# Patient Record
Sex: Female | Born: 1985 | Hispanic: No | Smoking: Never smoker
Health system: Southern US, Community
[De-identification: ages and names within clinical notes are randomized; demographics above are authoritative.]

## PROBLEM LIST (undated history)

## (undated) DIAGNOSIS — Z789 Other specified health status: Secondary | ICD-10-CM

## (undated) DIAGNOSIS — O24419 Gestational diabetes mellitus in pregnancy, unspecified control: Secondary | ICD-10-CM

---

## 2016-07-10 LAB — OB RESULTS CONSOLE ABO/RH: RH Type: POSITIVE

## 2016-07-10 LAB — OB RESULTS CONSOLE RPR: RPR: NONREACTIVE

## 2016-07-10 LAB — OB RESULTS CONSOLE HEPATITIS B SURFACE ANTIGEN: Hepatitis B Surface Ag: NEGATIVE

## 2016-07-10 LAB — OB RESULTS CONSOLE ANTIBODY SCREEN: Antibody Screen: NEGATIVE

## 2016-07-10 LAB — OB RESULTS CONSOLE GC/CHLAMYDIA
CHLAMYDIA, DNA PROBE: NEGATIVE
GC PROBE AMP, GENITAL: NEGATIVE

## 2016-07-10 LAB — OB RESULTS CONSOLE RUBELLA ANTIBODY, IGM: Rubella: IMMUNE

## 2016-07-10 LAB — OB RESULTS CONSOLE HIV ANTIBODY (ROUTINE TESTING): HIV: NONREACTIVE

## 2016-09-27 LAB — OB RESULTS CONSOLE GBS: STREP GROUP B AG: POSITIVE

## 2016-10-09 ENCOUNTER — Other Ambulatory Visit: Payer: Self-pay | Admitting: Obstetrics and Gynecology

## 2016-10-09 ENCOUNTER — Encounter (HOSPITAL_COMMUNITY): Payer: Self-pay

## 2016-10-09 ENCOUNTER — Inpatient Hospital Stay (HOSPITAL_COMMUNITY)
Admission: AD | Admit: 2016-10-09 | Discharge: 2016-10-09 | Disposition: A | Discharge status: Home / Self Care | Payer: Medicaid Other | Source: Ambulatory Visit | Attending: Obstetrics and Gynecology | Admitting: Obstetrics and Gynecology

## 2016-10-09 DIAGNOSIS — R1011 Right upper quadrant pain: Secondary | ICD-10-CM

## 2016-10-09 DIAGNOSIS — O26893 Other specified pregnancy related conditions, third trimester: Secondary | ICD-10-CM | POA: Diagnosis not present

## 2016-10-09 DIAGNOSIS — S39011A Strain of muscle, fascia and tendon of abdomen, initial encounter: Secondary | ICD-10-CM | POA: Diagnosis not present

## 2016-10-09 DIAGNOSIS — Z3A37 37 weeks gestation of pregnancy: Secondary | ICD-10-CM | POA: Insufficient documentation

## 2016-10-09 HISTORY — DX: Other specified health status: Z78.9

## 2016-10-09 MED ORDER — CYCLOBENZAPRINE HCL 10 MG PO TABS
10.0000 mg | ORAL_TABLET | Freq: Once | ORAL | Status: AC
  Administered 2016-10-09: 10 mg via ORAL
  Filled 2016-10-09: qty 1

## 2016-10-09 MED ORDER — CYCLOBENZAPRINE HCL 10 MG PO TABS: 10.0000 mg | ORAL_TABLET | Freq: Two times a day (BID) | ORAL | 0 refills | Status: DC | PRN

## 2016-10-09 NOTE — MAU Note (Signed)
Pt presents complaining of pain on her right back that is constant. Denies leaking or bleeding. Reports good fetal movement. Repeat c/s planned.

## 2016-10-09 NOTE — Discharge Instructions (Signed)
Round Ligament Pain Introduction The round ligament is a cord of muscle and tissue that helps to support the uterus. It can become a source of pain during pregnancy if it becomes stretched or twisted as the baby grows. The pain usually begins in the second trimester of pregnancy, and it can come and go until the baby is delivered. It is not a serious problem, and it does not cause harm to the baby. Round ligament pain is usually a short, sharp, and pinching pain, but it can also be a dull, lingering, and aching pain. The pain is felt in the lower side of the abdomen or in the groin. It usually starts deep in the groin and moves up to the outside of the hip area. Pain can occur with:  A sudden change in position.  Rolling over in bed.  Coughing or sneezing.  Physical activity. Follow these instructions at home: Watch your condition for any changes. Take these steps to help with your pain:  When the pain starts, relax. Then try:  Sitting down.  Flexing your knees up to your abdomen.  Lying on your side with one pillow under your abdomen and another pillow between your legs.  Sitting in a warm bath for 15-20 minutes or until the pain goes away.  Take over-the-counter and prescription medicines only as told by your health care provider.  Move slowly when you sit and stand.  Avoid long walks if they cause pain.  Stop or lessen your physical activities if they cause pain. Contact a health care provider if:  Your pain does not go away with treatment.  You feel pain in your back that you did not have before.  Your medicine is not helping. Get help right away if:  You develop a fever or chills.  You develop uterine contractions.  You develop vaginal bleeding.  You develop nausea or vomiting.  You develop diarrhea.  You have pain when you urinate. This information is not intended to replace advice given to you by your health care provider. Make sure you discuss any questions  you have with your health care provider. Document Released: 06/19/2008 Document Revised: 02/16/2016 Document Reviewed: 11/17/2014  2017 Elsevier  

## 2016-10-09 NOTE — MAU Provider Note (Signed)
  History     CSN: [id number]  Arrival date and time: 10/09/16 2005   First Provider Initiated Contact with Patient 10/09/16 2044      Chief Complaint  Patient presents with  . Back Pain   HPI Ms. Lindsey Diaz is a 31 y.o. G2P1001 at 9244w5d who presents to MAU today with complaint of right sided pain. The patient states pain has been worse since 1800 today. She has not taken anything for pain. She rates her pain at 8/10 now. She states pain is worse with ambulation and she is able to recreate the pain with certain movements. She denies UTI symptoms, hematuria, vaginal bleeding, LOF, contractions or complications with the pregnancy. She is scheduled for repeat C/S.   OB History    Gravida Para Term Preterm AB Living   2 1 1     1    SAB TAB Ectopic Multiple Live Births           1      Past Medical History:  Diagnosis Date  . Medical history non-contributory     Past Surgical History:  Procedure Laterality Date  . CESAREAN SECTION      History reviewed. No pertinent family history.  Social History  Substance Use Topics  . Smoking status: Never Smoker  . Smokeless tobacco: Never Used  . Alcohol use No    Allergies: No Known Allergies  Prescriptions Prior to Admission  Medication Sig Dispense Refill Last Dose  . guaiFENesin (MUCINEX) 600 MG 12 hr tablet Take 600 mg by mouth 2 (two) times daily as needed for cough or to loosen phlegm.   10/08/2016 at Unknown time  . Prenatal Vit-Fe Fumarate-FA (PRENATAL MULTIVITAMIN) TABS tablet Take 1 tablet by mouth daily at 12 noon.   10/09/2016 at Unknown time    Review of Systems  Constitutional: Negative for fever.  Gastrointestinal: Positive for abdominal pain. Negative for constipation, diarrhea, nausea and vomiting.  Genitourinary: Negative for dysuria, flank pain, frequency, hematuria and urgency.   Physical Exam   Blood pressure 132/78, pulse 90, temperature 98.3 F (36.8 C), temperature source Oral, resp. rate  18.  Physical Exam  Nursing note and vitals reviewed. Constitutional: She is oriented to person, place, and time. She appears well-developed and well-nourished. No distress.  HENT:  Head: Normocephalic and atraumatic.  Cardiovascular: Normal rate.   Respiratory: Effort normal.  GI: Soft. She exhibits no distension and no mass. There is no tenderness. There is no rebound, no guarding and no CVA tenderness.  Neurological: She is alert and oriented to person, place, and time.  Skin: Skin is warm and dry. No erythema.  Psychiatric: She has a normal mood and affect.   Cervical exam:  Dilation: Closed Exam by:: Danella DeisAnna Hayes, RN  Fetal Monitoring: Baseline: 140 bpm Variability: moderate Accelerations: 15 x 15 Decelerations: none Contractions: q 3-4 minutes with moderate UI  MAU Course  Procedures None  MDM Discussed with Dr. Henderson CloudHorvath. Recommends Flexeril PO in MAU.  Patient reports improvement in pain Assessment and Plan  A:  SIUP at 2844w5d Muscle strain, right abdomen  P:  Discharge home Rx for Flexeril given to patient  Labor precautions discussed Patient advised [facility] for routine prenatal care Patient may return to MAU as needed or if her condition were to change or worsen  Marny LowensteinJulie N Wenzel, PA-C  10/09/2016, 8:44 PM

## 2016-10-12 ENCOUNTER — Encounter (HOSPITAL_COMMUNITY): Payer: Self-pay

## 2016-10-15 ENCOUNTER — Encounter (HOSPITAL_COMMUNITY): Payer: Self-pay

## 2016-10-19 ENCOUNTER — Encounter (HOSPITAL_COMMUNITY)
Admission: RE | Admit: 2016-10-19 | Discharge: 2016-10-19 | Disposition: A | Discharge status: Home / Self Care | Payer: Medicaid Other | Source: Ambulatory Visit | Attending: Obstetrics and Gynecology | Admitting: Obstetrics and Gynecology

## 2016-10-19 LAB — TYPE AND SCREEN
ABO/RH(D): A POS
ANTIBODY SCREEN: NEGATIVE

## 2016-10-19 LAB — ABO/RH: ABO/RH(D): A POS

## 2016-10-19 LAB — CBC
HCT: 29.8 % — ABNORMAL LOW (ref 36.0–46.0)
Hemoglobin: 9.7 g/dL — ABNORMAL LOW (ref 12.0–15.0)
MCH: 25.2 pg — ABNORMAL LOW (ref 26.0–34.0)
MCHC: 32.6 g/dL (ref 30.0–36.0)
MCV: 77.4 fL — ABNORMAL LOW (ref 78.0–100.0)
PLATELETS: 283 10*3/uL (ref 150–400)
RBC: 3.85 MIL/uL — ABNORMAL LOW (ref 3.87–5.11)
RDW: 14.1 % (ref 11.5–15.5)
WBC: 10 10*3/uL (ref 4.0–10.5)

## 2016-10-19 NOTE — Patient Instructions (Signed)
20 Lindsey Diaz  10/19/2016   Your procedure is scheduled on:  10/22/2016  Enter through the Main Entrance of [facility] at 0530 AM.  Pick up the phone at the desk and dial [phone] .   Call this number if you have problems the morning of surgery: [phone]   Remember:   Do not eat food:After Midnight.  Do not drink clear liquids: After Midnight.  Take these medicines the morning of surgery with A SIP OF WATER: NONE   Do not wear jewelry, make-up or nail polish.  Do not wear lotions, powders, or perfumes. Do not wear deodorant.  Do not shave 48 hours prior to surgery.  Do not bring valuables to the hospital.  [facility] is not   responsible for any belongings or valuables brought to the hospital.  Contacts, dentures or bridgework may not be worn into surgery.  Leave suitcase in the car. After surgery it may be brought to your room.  For patients admitted to the hospital, checkout time is 11:00 AM the day of              discharge.   Patients discharged the day of surgery will not be allowed to drive             home.  Name and phone number of your driver: NA   Special Instructions:   N/A   Please read over the following fact sheets that you were given:   Surgical Site Infection Prevention

## 2016-10-20 LAB — RPR: RPR Ser Ql: NONREACTIVE

## 2016-10-21 NOTE — Anesthesia Preprocedure Evaluation (Addendum)
Anesthesia Evaluation  Patient identified by MRN, date of birth, ID band Patient awake    Reviewed: Allergy & Precautions, NPO status , Patient's Chart, lab work & pertinent test results  Airway Mallampati: II  TM Distance: >3 FB Neck ROM: Full    Dental no notable dental hx.    Pulmonary neg pulmonary ROS,    Pulmonary exam normal breath sounds clear to auscultation       Cardiovascular negative cardio ROS Normal cardiovascular exam Rhythm:Regular Rate:Normal     Neuro/Psych negative neurological ROS  negative psych ROS   GI/Hepatic negative GI ROS, Neg liver ROS,   Endo/Other  negative endocrine ROS  Renal/GU negative Renal ROS     Musculoskeletal negative musculoskeletal ROS (+)   Abdominal   Peds  Hematology negative hematology ROS (+)   Anesthesia Other Findings   Reproductive/Obstetrics (+) Pregnancy                            Anesthesia Physical Anesthesia Plan  ASA: II  Anesthesia Plan: Spinal   Post-op Pain Management:    Induction:   Airway Management Planned:   Additional Equipment:   Intra-op Plan:   Post-operative Plan:   Informed Consent: I have reviewed the patients History and Physical, chart, labs and discussed the procedure including the risks, benefits and alternatives for the proposed anesthesia with the patient or authorized representative who has indicated his/her understanding and acceptance.   Dental advisory given  Plan Discussed with: CRNA  Anesthesia Plan Comments:         Anesthesia Quick Evaluation

## 2016-10-22 ENCOUNTER — Encounter (HOSPITAL_COMMUNITY): Payer: Self-pay | Admitting: *Deleted

## 2016-10-22 ENCOUNTER — Inpatient Hospital Stay (HOSPITAL_COMMUNITY)
Admission: AD | Admit: 2016-10-22 | Payer: Medicaid Other | Source: Ambulatory Visit | Admitting: Obstetrics and Gynecology

## 2016-10-22 ENCOUNTER — Inpatient Hospital Stay (HOSPITAL_COMMUNITY): Payer: Medicaid Other | Admitting: Anesthesiology

## 2016-10-22 ENCOUNTER — Inpatient Hospital Stay (HOSPITAL_COMMUNITY)
Admission: RE | Admit: 2016-10-22 | Discharge: 2016-10-24 | DRG: 766 | Disposition: A | Discharge status: Home / Self Care | Payer: Medicaid Other | Source: Ambulatory Visit | Attending: Obstetrics and Gynecology | Admitting: Obstetrics and Gynecology

## 2016-10-22 ENCOUNTER — Encounter (HOSPITAL_COMMUNITY)
Admission: RE | Disposition: A | Discharge status: Home / Self Care | Payer: Self-pay | Source: Ambulatory Visit | Attending: Obstetrics and Gynecology

## 2016-10-22 DIAGNOSIS — O99824 Streptococcus B carrier state complicating childbirth: Secondary | ICD-10-CM | POA: Diagnosis present

## 2016-10-22 DIAGNOSIS — O34211 Maternal care for low transverse scar from previous cesarean delivery: Secondary | ICD-10-CM | POA: Diagnosis present

## 2016-10-22 DIAGNOSIS — O3663X0 Maternal care for excessive fetal growth, third trimester, not applicable or unspecified: Secondary | ICD-10-CM | POA: Diagnosis present

## 2016-10-22 DIAGNOSIS — Z3A39 39 weeks gestation of pregnancy: Secondary | ICD-10-CM | POA: Diagnosis not present

## 2016-10-22 LAB — TYPE AND SCREEN
ABO/RH(D): A POS
ANTIBODY SCREEN: NEGATIVE

## 2016-10-22 SURGERY — Surgical Case
Anesthesia: Spinal | Wound class: Clean Contaminated

## 2016-10-22 MED ORDER — IBUPROFEN 600 MG PO TABS
600.0000 mg | ORAL_TABLET | Freq: Four times a day (QID) | ORAL | Status: DC
  Administered 2016-10-23 – 2016-10-24 (×6): 600 mg via ORAL
  Filled 2016-10-22 (×6): qty 1

## 2016-10-22 MED ORDER — SIMETHICONE 80 MG PO CHEW
80.0000 mg | CHEWABLE_TABLET | Freq: Three times a day (TID) | ORAL | Status: DC
  Administered 2016-10-23 – 2016-10-24 (×3): 80 mg via ORAL
  Filled 2016-10-22 (×3): qty 1

## 2016-10-22 MED ORDER — OXYCODONE-ACETAMINOPHEN 5-325 MG PO TABS: 2.0000 | ORAL_TABLET | ORAL | Status: DC | PRN

## 2016-10-22 MED ORDER — PROMETHAZINE HCL 25 MG/ML IJ SOLN: 6.2500 mg | INTRAMUSCULAR | Status: DC | PRN

## 2016-10-22 MED ORDER — NALOXONE HCL 2 MG/2ML IJ SOSY
1.0000 ug/kg/h | PREFILLED_SYRINGE | INTRAVENOUS | Status: DC | PRN
  Filled 2016-10-22: qty 2

## 2016-10-22 MED ORDER — COCONUT OIL OIL: 1.0000 "application " | TOPICAL_OIL | Status: DC | PRN

## 2016-10-22 MED ORDER — LACTATED RINGERS IV SOLN
INTRAVENOUS | Status: DC | PRN
  Administered 2016-10-22: 08:00:00 via INTRAVENOUS

## 2016-10-22 MED ORDER — DIPHENHYDRAMINE HCL 25 MG PO CAPS: 25.0000 mg | ORAL_CAPSULE | Freq: Four times a day (QID) | ORAL | Status: DC | PRN

## 2016-10-22 MED ORDER — MEPERIDINE HCL 25 MG/ML IJ SOLN: 6.2500 mg | INTRAMUSCULAR | Status: DC | PRN

## 2016-10-22 MED ORDER — TETANUS-DIPHTH-ACELL PERTUSSIS 5-2.5-18.5 LF-MCG/0.5 IM SUSP: 0.5000 mL | Freq: Once | INTRAMUSCULAR | Status: DC

## 2016-10-22 MED ORDER — DIPHENHYDRAMINE HCL 25 MG PO CAPS: 25.0000 mg | ORAL_CAPSULE | ORAL | Status: DC | PRN

## 2016-10-22 MED ORDER — LACTATED RINGERS IV SOLN
INTRAVENOUS | Status: DC
  Administered 2016-10-22 (×3): via INTRAVENOUS

## 2016-10-22 MED ORDER — SODIUM CHLORIDE 0.9 % IR SOLN
Status: DC | PRN
  Administered 2016-10-22: 1

## 2016-10-22 MED ORDER — SODIUM CHLORIDE 0.9% FLUSH: 3.0000 mL | INTRAVENOUS | Status: DC | PRN

## 2016-10-22 MED ORDER — MENTHOL 3 MG MT LOZG: 1.0000 | LOZENGE | OROMUCOSAL | Status: DC | PRN

## 2016-10-22 MED ORDER — PHENYLEPHRINE 8 MG IN D5W 100 ML (0.08MG/ML) PREMIX OPTIME
INJECTION | INTRAVENOUS | Status: DC | PRN
  Administered 2016-10-22: 60 ug/min via INTRAVENOUS

## 2016-10-22 MED ORDER — ONDANSETRON HCL 4 MG/2ML IJ SOLN: 4.0000 mg | Freq: Three times a day (TID) | INTRAMUSCULAR | Status: DC | PRN

## 2016-10-22 MED ORDER — PHENYLEPHRINE 8 MG IN D5W 100 ML (0.08MG/ML) PREMIX OPTIME
INJECTION | INTRAVENOUS | Status: AC
  Filled 2016-10-22: qty 100

## 2016-10-22 MED ORDER — LACTATED RINGERS IV SOLN: INTRAVENOUS | Status: DC

## 2016-10-22 MED ORDER — NALOXONE HCL 0.4 MG/ML IJ SOLN: 0.4000 mg | INTRAMUSCULAR | Status: DC | PRN

## 2016-10-22 MED ORDER — KETOROLAC TROMETHAMINE 30 MG/ML IJ SOLN
30.0000 mg | Freq: Four times a day (QID) | INTRAMUSCULAR | Status: AC | PRN
  Filled 2016-10-22: qty 1

## 2016-10-22 MED ORDER — FENTANYL CITRATE (PF) 100 MCG/2ML IJ SOLN
INTRAMUSCULAR | Status: AC
  Filled 2016-10-22: qty 2

## 2016-10-22 MED ORDER — SIMETHICONE 80 MG PO CHEW: 80.0000 mg | CHEWABLE_TABLET | ORAL | Status: DC | PRN

## 2016-10-22 MED ORDER — NALBUPHINE HCL 10 MG/ML IJ SOLN: 5.0000 mg | Freq: Once | INTRAMUSCULAR | Status: DC | PRN

## 2016-10-22 MED ORDER — MORPHINE SULFATE (PF) 0.5 MG/ML IJ SOLN
INTRAMUSCULAR | Status: DC | PRN
  Administered 2016-10-22: .2 mg via INTRATHECAL

## 2016-10-22 MED ORDER — OXYTOCIN 40 UNITS IN LACTATED RINGERS INFUSION - SIMPLE MED: 2.5000 [IU]/h | INTRAVENOUS | Status: AC

## 2016-10-22 MED ORDER — SIMETHICONE 80 MG PO CHEW
80.0000 mg | CHEWABLE_TABLET | ORAL | Status: DC
  Administered 2016-10-23: 80 mg via ORAL
  Filled 2016-10-22: qty 1

## 2016-10-22 MED ORDER — NALBUPHINE HCL 10 MG/ML IJ SOLN: 5.0000 mg | INTRAMUSCULAR | Status: DC | PRN

## 2016-10-22 MED ORDER — KETOROLAC TROMETHAMINE 30 MG/ML IJ SOLN
30.0000 mg | Freq: Four times a day (QID) | INTRAMUSCULAR | Status: AC | PRN
  Administered 2016-10-22: 30 mg via INTRAVENOUS

## 2016-10-22 MED ORDER — ACETAMINOPHEN 325 MG PO TABS: 650.0000 mg | ORAL_TABLET | ORAL | Status: DC | PRN

## 2016-10-22 MED ORDER — OXYCODONE-ACETAMINOPHEN 5-325 MG PO TABS
1.0000 | ORAL_TABLET | ORAL | Status: DC | PRN
  Administered 2016-10-24 (×2): 1 via ORAL
  Filled 2016-10-22 (×2): qty 1

## 2016-10-22 MED ORDER — OXYTOCIN 10 UNIT/ML IJ SOLN
INTRAVENOUS | Status: DC | PRN
  Administered 2016-10-22: 40 [IU] via INTRAVENOUS

## 2016-10-22 MED ORDER — ZOLPIDEM TARTRATE 5 MG PO TABS: 5.0000 mg | ORAL_TABLET | Freq: Every evening | ORAL | Status: DC | PRN

## 2016-10-22 MED ORDER — HYDROMORPHONE HCL 1 MG/ML IJ SOLN: 0.2500 mg | INTRAMUSCULAR | Status: DC | PRN

## 2016-10-22 MED ORDER — MORPHINE SULFATE (PF) 0.5 MG/ML IJ SOLN
INTRAMUSCULAR | Status: AC
  Filled 2016-10-22: qty 10

## 2016-10-22 MED ORDER — CEFAZOLIN SODIUM-DEXTROSE 2-4 GM/100ML-% IV SOLN
2.0000 g | INTRAVENOUS | Status: AC
  Administered 2016-10-22: 2 g via INTRAVENOUS

## 2016-10-22 MED ORDER — OXYTOCIN 10 UNIT/ML IJ SOLN
INTRAMUSCULAR | Status: AC
  Filled 2016-10-22: qty 4

## 2016-10-22 MED ORDER — PRENATAL MULTIVITAMIN CH
1.0000 | ORAL_TABLET | Freq: Every day | ORAL | Status: DC
  Administered 2016-10-23 – 2016-10-24 (×2): 1 via ORAL
  Filled 2016-10-22 (×2): qty 1

## 2016-10-22 MED ORDER — DIPHENHYDRAMINE HCL 50 MG/ML IJ SOLN: 12.5000 mg | INTRAMUSCULAR | Status: DC | PRN

## 2016-10-22 MED ORDER — BUPIVACAINE IN DEXTROSE 0.75-8.25 % IT SOLN
INTRATHECAL | Status: DC | PRN
  Administered 2016-10-22: 1.6 mL via INTRATHECAL

## 2016-10-22 MED ORDER — SCOPOLAMINE 1 MG/3DAYS TD PT72
1.0000 | MEDICATED_PATCH | Freq: Once | TRANSDERMAL | Status: DC
  Administered 2016-10-22: 1.5 mg via TRANSDERMAL
  Filled 2016-10-22: qty 1

## 2016-10-22 MED ORDER — WITCH HAZEL-GLYCERIN EX PADS: 1.0000 "application " | MEDICATED_PAD | CUTANEOUS | Status: DC | PRN

## 2016-10-22 MED ORDER — SENNOSIDES-DOCUSATE SODIUM 8.6-50 MG PO TABS
2.0000 | ORAL_TABLET | ORAL | Status: DC
  Administered 2016-10-23: 2 via ORAL
  Filled 2016-10-22: qty 2

## 2016-10-22 MED ORDER — LACTATED RINGERS IV SOLN
INTRAVENOUS | Status: DC
  Administered 2016-10-22 – 2016-10-23 (×2): via INTRAVENOUS

## 2016-10-22 MED ORDER — ONDANSETRON HCL 4 MG/2ML IJ SOLN
INTRAMUSCULAR | Status: AC
  Filled 2016-10-22: qty 2

## 2016-10-22 MED ORDER — DIBUCAINE 1 % RE OINT: 1.0000 "application " | TOPICAL_OINTMENT | RECTAL | Status: DC | PRN

## 2016-10-22 MED ORDER — ONDANSETRON HCL 4 MG/2ML IJ SOLN
INTRAMUSCULAR | Status: DC | PRN
  Administered 2016-10-22: 4 mg via INTRAVENOUS

## 2016-10-22 SURGICAL SUPPLY — 34 items
BENZOIN TINCTURE PRP APPL 2/3 (GAUZE/BANDAGES/DRESSINGS) ×3 IMPLANT
CHLORAPREP W/TINT 26ML (MISCELLANEOUS) ×3 IMPLANT
CLAMP CORD UMBIL (MISCELLANEOUS) IMPLANT
CLOSURE STERI STRIP 1/2 X4 (GAUZE/BANDAGES/DRESSINGS) ×3 IMPLANT
CLOTH BEACON ORANGE TIMEOUT ST (SAFETY) ×3 IMPLANT
DRSG OPSITE POSTOP 4X10 (GAUZE/BANDAGES/DRESSINGS) ×3 IMPLANT
ELECT REM PT RETURN 9FT ADLT (ELECTROSURGICAL) ×3
ELECTRODE REM PT RTRN 9FT ADLT (ELECTROSURGICAL) ×1 IMPLANT
EXTRACTOR VACUUM M CUP 4 TUBE (SUCTIONS) IMPLANT
EXTRACTOR VACUUM M CUP 4' TUBE (SUCTIONS)
GLOVE BIOGEL PI IND STRL 6.5 (GLOVE) ×1 IMPLANT
GLOVE BIOGEL PI IND STRL 7.0 (GLOVE) ×1 IMPLANT
GLOVE BIOGEL PI INDICATOR 6.5 (GLOVE) ×2
GLOVE BIOGEL PI INDICATOR 7.0 (GLOVE) ×2
GLOVE ECLIPSE 6.5 STRL STRAW (GLOVE) ×3 IMPLANT
GOWN STRL REUS W/TWL LRG LVL3 (GOWN DISPOSABLE) ×6 IMPLANT
KIT ABG SYR 3ML LUER SLIP (SYRINGE) IMPLANT
NEEDLE HYPO 25X5/8 SAFETYGLIDE (NEEDLE) IMPLANT
NS IRRIG 1000ML POUR BTL (IV SOLUTION) ×3 IMPLANT
PACK C SECTION WH (CUSTOM PROCEDURE TRAY) ×3 IMPLANT
PAD ABD 7.5X8 STRL (GAUZE/BANDAGES/DRESSINGS) IMPLANT
PAD OB MATERNITY 4.3X12.25 (PERSONAL CARE ITEMS) ×3 IMPLANT
PENCIL SMOKE EVAC W/HOLSTER (ELECTROSURGICAL) ×3 IMPLANT
RTRCTR C-SECT PINK 25CM LRG (MISCELLANEOUS) ×3 IMPLANT
STAPLER VISISTAT 35W (STAPLE) ×3 IMPLANT
SUT MON AB 2-0 CT1 27 (SUTURE) ×3 IMPLANT
SUT MON AB 4-0 PS1 27 (SUTURE) IMPLANT
SUT PDS AB 0 CTX 60 (SUTURE) IMPLANT
SUT PLAIN 2 0 XLH (SUTURE) IMPLANT
SUT VIC AB 0 CTX 36 (SUTURE) ×8
SUT VIC AB 0 CTX36XBRD ANBCTRL (SUTURE) ×4 IMPLANT
SUT VIC AB 4-0 KS 27 (SUTURE) IMPLANT
TOWEL OR 17X24 6PK STRL BLUE (TOWEL DISPOSABLE) ×3 IMPLANT
TRAY FOLEY CATH SILVER 14FR (SET/KITS/TRAYS/PACK) ×3 IMPLANT

## 2016-10-22 NOTE — OR Nursing (Signed)
The admission and pre-op orders for the 0715 AM scheduled C/S was not in . The only orders for this patient was PACU orders from anesthesia.RN called Dr. Claiborne Billingsallahan for admission and pre-op orders for 0715 AM scheduled C/S at 0437 AM. Dr. Claiborne Billingsallahan stated it will be in later.

## 2016-10-22 NOTE — Consult Note (Signed)
Neonatology Note:   Attendance at C-section:    I was asked by Dr. Callahan to attend this repeat C/S at term. The mother is a G2P1, GBS pos with good prenatal care. ROM at delivery, fluid clear. Infant vigorous with good spontaneous cry and tone. Needed only minimal bulb suctioning. Ap 8/9. Lungs clear to ausc in DR. To CN to care of Pediatrician.  Aundrea Horace C. Eric Nees, MD 

## 2016-10-22 NOTE — Anesthesia Postprocedure Evaluation (Signed)
Anesthesia Post Note  Patient: Lindsey Diaz  Procedure(s) Performed: Procedure(s) (LRB): CESAREAN SECTION (N/A)  Patient location during evaluation: PACU Anesthesia Type: Spinal Level of consciousness: awake and alert Pain management: pain level controlled Vital Signs Assessment: post-procedure vital signs reviewed and stable Respiratory status: spontaneous breathing and respiratory function stable Cardiovascular status: blood pressure returned to baseline and stable Postop Assessment: spinal receding Anesthetic complications: no        Last Vitals:  Vitals:   10/22/16 1100 10/22/16 1200  BP: (!) 122/99 122/75  Pulse: (!) 58 (!) 54  Resp: 14 14  Temp: 36.5 C 36.6 C    Last Pain:  Vitals:   10/22/16 1100  TempSrc: Axillary  PainSc:    Pain Goal:                 Lindsey Diaz

## 2016-10-22 NOTE — Anesthesia Postprocedure Evaluation (Signed)
Anesthesia Post Note  Patient: Lindsey Diaz  Procedure(s) Performed: Procedure(s) (LRB): CESAREAN SECTION (N/A)  Patient location during evaluation: Mother Baby Anesthesia Type: Spinal Level of consciousness: awake Pain management: pain level controlled Vital Signs Assessment: post-procedure vital signs reviewed and stable Respiratory status: spontaneous breathing Cardiovascular status: stable Postop Assessment: no headache, no backache, spinal receding, patient able to bend at knees, no signs of nausea or vomiting and adequate PO intake Anesthetic complications: no        Last Vitals:  Vitals:   10/22/16 1310 10/22/16 1415  BP: 96/79 110/61  Pulse: 63 68  Resp: 16 14  Temp: 36.6 C 36.6 C    Last Pain:  Vitals:   10/22/16 1415  TempSrc: Oral  PainSc: 1    Pain Goal: Patients Stated Pain Goal: 1 (10/22/16 1415)               Marvon Shillingburg

## 2016-10-22 NOTE — Addendum Note (Signed)
Addendum  created 10/22/16 1510 by Llewelyn Sheaffer L Sallie Maker, CRNA   Sign clinical note    

## 2016-10-22 NOTE — H&P (Signed)
31 y.o. 6557w4d  G2P1001 comes in c/o for scheduled R C/S.  Otherwise has good fetal movement and no bleeding.  Past Medical History:  Diagnosis Date  . Medical history non-contributory     Past Surgical History:  Procedure Laterality Date  . CESAREAN SECTION      OB History  Gravida Para Term Preterm AB Living  2 1 1     1   SAB TAB Ectopic Multiple Live Births          1    # Outcome Date GA Lbr Len/2nd Weight Sex Delivery Anes PTL Lv  2 Current           1 Term 2016   4.621 kg (10 lb 3 oz) M CS-LTranv   LIV      Social History   Social History  . Marital status: Legally Separated    Spouse name: N/A  . Number of children: N/A  . Years of education: N/A   Occupational History  . Not on file.   Social History Main Topics  . Smoking status: Never Smoker  . Smokeless tobacco: Never Used  . Alcohol use No  . Drug use: No  . Sexual activity: Not Currently   Other Topics Concern  . Not on file   Social History Narrative  . No narrative on file   Patient has no known allergies.    Prenatal Transfer Tool  Maternal Diabetes: No Genetic Screening: Declined Maternal Ultrasounds/Referrals: Normal Fetal Ultrasounds or other Referrals:  None Maternal Substance Abuse:  No Significant Maternal Medications:  None Significant Maternal Lab Results: Lab values include: Group B Strep positive  Other PNC: uncomplicated.    There were no vitals filed for this visit.   Lungs/Cor:  NAD Abdomen:  soft, gravid Ex:  no cords, erythema SVE:  deferrred FHTs:  Per pre-op staff    A/P   Admit for scheduled Repeat C/S.    History of prior c/s for suspected macrosomia, baby was 10#3  S>D this pregnancy, lastUS 09/27/2016 >90% at approx 3800g  GBS Pos  Pre-op abx with 2g Ancef and other routine pre-op care.  Philip AspenALLAHAN, Atianna Haidar

## 2016-10-22 NOTE — Lactation Note (Signed)
This note was copied from a baby's chart. Lactation Consultation Note  Patient Name: Lindsey Diaz Today's Date: 10/22/2016 Reason for consult: Initial assessment Breastfeeding consultation services and support information given to patient.  Newborn is 7 hours old and she has been to breast 3 times and formula bottle x 1.  Mom breastfed her first baby.  She states she desires to give formula because she doesn't have enough milk.  Discussed colostrum and milk coming to volume, supply and demand.  I observed mom independently latch baby well using cradle hold.  Baby nursing actively.  Instructed to use alternate breast massage during feeding.  Encouraged to call with concerns/assist prn.  Maternal Data Does the patient have breastfeeding experience prior to this delivery?: Yes  Feeding Feeding Type: Breast Fed Length of feed: 10 min  LATCH Score/Interventions Latch: Grasps breast easily, tongue down, lips flanged, rhythmical sucking.  Audible Swallowing: A few with stimulation Intervention(s): Skin to skin;Hand expression;Alternate breast massage  Type of Nipple: Everted at rest and after stimulation  Comfort (Breast/Nipple): Soft / non-tender     Hold (Positioning): No assistance needed to correctly position infant at breast.  LATCH Score: 9  Lactation Tools Discussed/Used     Consult Status Consult Status: Follow-up Date: 10/23/16 Follow-up type: In-patient    Huston FoleyMOULDEN, Cain Fitzhenry S 10/22/2016, 3:37 PM

## 2016-10-22 NOTE — Brief Op Note (Signed)
10/22/2016  8:59 AM  PATIENT:  AmeLie Horiuchi  31 y.o. female  PRE-OPERATIVE DIAGNOSIS:  previous c/s  POST-OPERATIVE DIAGNOSIS:  previous c/s  PROCEDURE:  Procedure(s): CESAREAN SECTION (N/A)  SURGEON:  Surgeon(s) and Role:    * Philip AspenSidney Britany Callicott, DO - Primary Assisant: Ilda Moriichard Kaplan, MD  ANESTHESIA:   spinal  EBL:  Total I/O In: 2500 [I.V.:2500] Out: 900 [Urine:300; Blood:600]  SPECIMEN:  No Specimen  DISPOSITION OF SPECIMEN:  N/A  COUNTS:  YES  PLAN OF CARE: Admit to inpatient   PATIENT DISPOSITION:  PACU - hemodynamically stable.   Delay start of Pharmacological VTE agent (>24hrs) due to surgical blood loss or risk of bleeding: not applicable

## 2016-10-22 NOTE — Transfer of Care (Signed)
Immediate Anesthesia Transfer of Care Note  Patient: Lindsey Diaz  Procedure(s) Performed: Procedure(s): CESAREAN SECTION (N/A)  Patient Location: PACU  Anesthesia Type:Spinal  Level of Consciousness: awake, alert  and oriented  Airway & Oxygen Therapy: Patient Spontanous Breathing  Post-op Assessment: Report given to RN and Post -op Vital signs reviewed and stable  Post vital signs: Reviewed and stable  Last Vitals:  Vitals:   10/22/16 0627 10/22/16 0638  BP:  130/83  Resp:  20  Temp: 37.3 C     Last Pain:  Vitals:   10/22/16 0716  TempSrc:   PainSc: 0-No pain         Complications: No apparent anesthesia complications

## 2016-10-23 LAB — CBC
HEMATOCRIT: 26.2 % — AB (ref 36.0–46.0)
HEMOGLOBIN: 8.6 g/dL — AB (ref 12.0–15.0)
MCH: 25.7 pg — ABNORMAL LOW (ref 26.0–34.0)
MCHC: 32.8 g/dL (ref 30.0–36.0)
MCV: 78.2 fL (ref 78.0–100.0)
Platelets: 209 10*3/uL (ref 150–400)
RBC: 3.35 MIL/uL — ABNORMAL LOW (ref 3.87–5.11)
RDW: 14.5 % (ref 11.5–15.5)
WBC: 10.2 10*3/uL (ref 4.0–10.5)

## 2016-10-23 LAB — BIRTH TISSUE RECOVERY COLLECTION (PLACENTA DONATION)

## 2016-10-23 MED ORDER — FERROUS SULFATE 325 (65 FE) MG PO TABS
325.0000 mg | ORAL_TABLET | Freq: Two times a day (BID) | ORAL | Status: DC
  Administered 2016-10-23 – 2016-10-24 (×3): 325 mg via ORAL
  Filled 2016-10-23 (×3): qty 1

## 2016-10-23 NOTE — Progress Notes (Signed)
Patient was orthostatic per night RN, Foley catheter left in place.  Patient up to bathroom this morning, catheter removed, patient tolerated moving well.  No dizziness, or lightheadedness.  Encouraged patient to void twice.  Patient walked in the hall also this morning.  Progressing well. Jaecob Lowden D

## 2016-10-23 NOTE — Lactation Note (Signed)
This note was copied from a baby's chart. Lactation Consultation Note  Patient Name: Lindsey Diaz Today's Date: 10/23/2016 Reason for consult: Follow-up assessment Baby at 36 hr of life. Mom reports she is not making enough milk. Demonstrated manual expression, colostrum noted bilaterally, but mom does not think it is enough. She is offering both breast then formula bottles as she feels the baby needs them. She denies breast or nipple pain. She declined DEBP. She is aware of lactation services and support group. She will offer the breast on demand 8+/24hr and supplement as needed per volume guidelines.   Maternal Data    Feeding Feeding Type: Bottle Fed - Formula Nipple Type: Slow - flow Length of feed: 15 min  LATCH Score/Interventions Latch: Grasps breast easily, tongue down, lips flanged, rhythmical sucking.  Audible Swallowing: A few with stimulation  Type of Nipple: Everted at rest and after stimulation  Comfort (Breast/Nipple): Soft / non-tender     Hold (Positioning): No assistance needed to correctly position infant at breast.  LATCH Score: 9  Lactation Tools Discussed/Used WIC Program: Yes   Consult Status Consult Status: Follow-up Date: 10/24/16 Follow-up type: In-patient    Lindsey Diaz 10/23/2016, 9:12 PM

## 2016-10-23 NOTE — Progress Notes (Signed)
  Patient is eating, ambulating, voiding.  Pain control is good.  Vitals:   10/22/16 1415 10/22/16 1748 10/22/16 2200 10/23/16 0100  BP: 110/61 110/62 (!) 112/50 (!) 105/55  Pulse: 68 (!) 58 (!) 58 70  Resp: 14 16 18 18   Temp: 97.8 F (36.6 C) 97.7 F (36.5 C) 98.2 F (36.8 C)   TempSrc: Oral Oral Oral   SpO2: 98% 99% 97% 98%  Weight:      Height:        lungs:   clear to auscultation cor:    RRR Abdomen:  soft, appropriate tenderness, incisions intact and without erythema or exudate ex:    no cords   Lab Results  Component Value Date   WBC 10.2 10/23/2016   HGB 8.6 (L) 10/23/2016   HCT 26.2 (L) 10/23/2016   MCV 78.2 10/23/2016   PLT 209 10/23/2016    --/--/A POS (01/29 0644)/RI  A/P    Post operative day 1.  Routine post op and postpartum care.  Expect d/c routine.  Percocet for pain control. Iron for anemia- assymp.

## 2016-10-24 MED ORDER — OXYCODONE-ACETAMINOPHEN 5-325 MG PO TABS: 2.0000 | ORAL_TABLET | ORAL | 0 refills | Status: DC | PRN

## 2016-10-24 MED ORDER — FERROUS SULFATE 325 (65 FE) MG PO TABS: 325.0000 mg | ORAL_TABLET | Freq: Two times a day (BID) | ORAL | 3 refills | Status: DC

## 2016-10-24 NOTE — Discharge Summary (Signed)
Obstetric Discharge Summary Reason for Admission: cesarean section Prenatal Procedures: ultrasound Intrapartum Procedures: cesarean: low cervical, transverse Postpartum Procedures: none Complications-Operative and Postpartum: none Hemoglobin  Date Value Ref Range Status  10/23/2016 8.6 (L) 12.0 - 15.0 g/dL Final   HCT  Date Value Ref Range Status  10/23/2016 26.2 (L) 36.0 - 46.0 % Final    Physical Exam:  General: alert and cooperative Lochia: appropriate Uterine Fundus: firm Incision: healing well, no significant drainage DVT Evaluation: No evidence of DVT seen on physical exam.  Discharge Diagnoses: Term Pregnancy-delivered  Discharge Information: Date: 10/24/2016 Activity: pelvic rest Diet: routine Medications: PNV, Ibuprofen, Iron and Percocet Condition: stable Instructions: refer to practice specific booklet Discharge to: home Follow-up Information    Liridona Mashaw, DO Follow up in 2 week(s).   Specialty:  Obstetrics and Gynecology Contact information: [address] [place] [address] [phone]           Newborn Data: Live born female  Birth Weight: 11 lb 3.9 oz (5100 g) APGAR: 8, 9  Home with mother.  Philip AspenCALLAHAN, Chyna Kneece 10/24/2016, 10:28 AM

## 2016-10-24 NOTE — Plan of Care (Signed)
Problem: Health Behavior/Discharge Planning: Goal: Ability to manage health-related needs will improve Outcome: Completed/Met Date Met: 10/24/16 Discharge education and paperwork discussed.  Reasons to call the MD reviewed.  Pt verbalizes understanding.

## 2016-10-25 NOTE — Op Note (Signed)
Lindsey Diaz, SCHABEN NO.:  [id number]  MEDICAL RECORD NO.:  [id number]  LOCATION:  9104                          FACILITY:  [facility]  PHYSICIAN:  Philip Aspen, DO    DATE OF BIRTH:  02-15-1986  DATE OF PROCEDURE: DATE OF DISCHARGE:                              OPERATIVE REPORT   PREOPERATIVE DIAGNOSIS:  Desired repeat cesarean section.  POSTOPERATIVE DIAGNOSIS:  Desired repeat cesarean section.  PROCEDURE:  Low-transverse cesarean section.  SURGEON:  Philip Aspen, DO.  ASSISTANT:  Ilda Mori, M.D.  ANESTHESIA:  Spinal.  IV FLUIDS:  2500 mL.  URINE OUTPUT:  300 mL.  ESTIMATED BLOOD LOSS:  600 mL.  SPECIMENS:  None.  COMPLICATIONS:  None.  FINDINGS:  Normal appearing uterus, ovaries, and tubes bilaterally. Female infant with Apgars of 8 and 9, weighing 11 pounds 3 ounces.  DESCRIPTION OF PROCEDURE:  The patient was taken to the operating room where spinal anesthesia was administered and found to be adequate.  She was prepped and draped in the normal sterile fashion in dorsal supine position.  A Pfannenstiel skin incision was made with a scalpel after which the prior keloid scar was removed with a scalpel.  Bovie cautery was then used to carry the incision down to the underlying layer of fascia.  The fascia was incised with a scalpel and extended laterally with Mayo scissors.  Kocher clamps were placed at the superior aspect of the fascial incision and rectus muscles were dissected off bluntly and sharply.  Kocher clamps were then placed at the inferior aspect of the fascial incision and again rectus muscles were dissected off bluntly and sharply.  The rectus muscles were separated with the use of hemostat and the peritoneum identified and entered bluntly.  Moderate amount of scar tissue involving these upper layers was noted.  The peritoneum was entered without difficulty and extended by manual traction laterally. Palpation of the abdomen  and pelvis were performed.  No abnormalities or significant scar tissue noted.  An Alexis self retractor was placed without difficulty.  A bladder flap was created by tenting the vesicouterine peritoneum, entering sharply with Metzenbaum scissors, and developing digitally.  A scalpel was then used to make a low-transverse cesarean incision and the amniotic sac was entered bluntly.  The infant's head was located, elevated, and delivered without difficulty followed by the remainder of the infant's body.  After 30 seconds to 1 minute of delay, the cord was clamped, cut, and the infant was handed off to awaiting neonatology after being bulb suctioned as well.  The cord blood was collected and external massage of the uterus was performed with gentle traction on the umbilical cord precipitating removal of placenta.  The uterus was then cleared of all clot and debris, and the uterine incision closed in 2 layers, a running locked suture of 0 Vicryl, first followed by a second layer of horizontal Lembert imbrication.  Excellent hemostasis was noted.  The Alexis self retractor was removed.  The peritoneum was identified, grasped with curved Kelly clamps, and reapproximated and closed with 4-0 Monocryl in a running fashion.  The fascia was then reapproximated and closed with looped PDS in a running fashion.  The subcutaneous  tissue was irrigated, dried, and minimal use of Bovie cautery was needed to ensure excellent hemostasis.  Skin was then reapproximated and closed with staples.  The left side of the incision had a small dog-ear at the very end.  So a scalpel was used to remove the dog-ear for better skin approximation and better cosmesis.  The patient tolerated the procedure well.  Sponge, lap, and needle counts were correct x2.  The patient was taken to recovery in stable condition.          ______________________________ Philip AspenSidney Terris Germano, DO     [place]/MEDQ  D:  10/24/2016  T:  10/25/2016   Job:  [id number]

## 2018-05-12 LAB — OB RESULTS CONSOLE GC/CHLAMYDIA
Chlamydia: NEGATIVE
Gonorrhea: NEGATIVE

## 2018-05-12 LAB — OB RESULTS CONSOLE RPR: RPR: NONREACTIVE

## 2018-05-12 LAB — OB RESULTS CONSOLE HIV ANTIBODY (ROUTINE TESTING): HIV: NONREACTIVE

## 2018-05-12 LAB — OB RESULTS CONSOLE ANTIBODY SCREEN: Antibody Screen: NEGATIVE

## 2018-05-12 LAB — OB RESULTS CONSOLE HEPATITIS B SURFACE ANTIGEN: Hepatitis B Surface Ag: NEGATIVE

## 2018-05-12 LAB — OB RESULTS CONSOLE RUBELLA ANTIBODY, IGM: Rubella: IMMUNE

## 2018-08-06 ENCOUNTER — Encounter: Payer: Medicaid Other | Attending: Obstetrics and Gynecology | Admitting: Registered"

## 2018-08-06 DIAGNOSIS — O99810 Abnormal glucose complicating pregnancy: Secondary | ICD-10-CM

## 2018-08-06 DIAGNOSIS — O24419 Gestational diabetes mellitus in pregnancy, unspecified control: Secondary | ICD-10-CM | POA: Insufficient documentation

## 2018-08-06 DIAGNOSIS — Z713 Dietary counseling and surveillance: Secondary | ICD-10-CM | POA: Diagnosis not present

## 2018-08-08 ENCOUNTER — Encounter: Payer: Self-pay | Admitting: Registered"

## 2018-08-08 DIAGNOSIS — O99810 Abnormal glucose complicating pregnancy: Secondary | ICD-10-CM | POA: Insufficient documentation

## 2018-08-08 NOTE — Progress Notes (Signed)

## 2018-08-20 ENCOUNTER — Other Ambulatory Visit (HOSPITAL_COMMUNITY): Payer: Self-pay | Admitting: Obstetrics and Gynecology

## 2018-08-20 DIAGNOSIS — Q234 Hypoplastic left heart syndrome: Secondary | ICD-10-CM

## 2018-08-20 DIAGNOSIS — Z3689 Encounter for other specified antenatal screening: Secondary | ICD-10-CM

## 2018-08-20 DIAGNOSIS — Z3A33 33 weeks gestation of pregnancy: Secondary | ICD-10-CM

## 2018-08-28 ENCOUNTER — Encounter (HOSPITAL_COMMUNITY): Payer: Self-pay | Admitting: *Deleted

## 2018-09-01 ENCOUNTER — Encounter (HOSPITAL_COMMUNITY): Payer: Self-pay

## 2018-09-01 ENCOUNTER — Ambulatory Visit (HOSPITAL_COMMUNITY)
Admission: RE | Admit: 2018-09-01 | Discharge: 2018-09-01 | Disposition: A | Discharge status: Home / Self Care | Payer: Medicaid Other | Source: Ambulatory Visit | Attending: Obstetrics and Gynecology | Admitting: Obstetrics and Gynecology

## 2018-09-01 DIAGNOSIS — Z3A33 33 weeks gestation of pregnancy: Secondary | ICD-10-CM | POA: Insufficient documentation

## 2018-09-01 DIAGNOSIS — Q234 Hypoplastic left heart syndrome: Secondary | ICD-10-CM

## 2018-09-01 DIAGNOSIS — O359XX0 Maternal care for (suspected) fetal abnormality and damage, unspecified, not applicable or unspecified: Secondary | ICD-10-CM | POA: Insufficient documentation

## 2018-09-01 DIAGNOSIS — Z3689 Encounter for other specified antenatal screening: Secondary | ICD-10-CM | POA: Diagnosis present

## 2018-09-01 DIAGNOSIS — O24410 Gestational diabetes mellitus in pregnancy, diet controlled: Secondary | ICD-10-CM | POA: Diagnosis not present

## 2018-09-01 DIAGNOSIS — Z363 Encounter for antenatal screening for malformations: Secondary | ICD-10-CM | POA: Diagnosis not present

## 2018-09-01 DIAGNOSIS — O34219 Maternal care for unspecified type scar from previous cesarean delivery: Secondary | ICD-10-CM | POA: Diagnosis not present

## 2018-09-01 HISTORY — DX: Gestational diabetes mellitus in pregnancy, unspecified control: O24.419

## 2018-09-01 IMAGING — US US MFM OB DETAIL+14 WK
1 series · 14 of 28 positions shown · non-contrast
Comparison: none

[Series 1: us mfm ob detail+14 wk · 108 acquisitions, 14 frames shown]
[im 4/108]
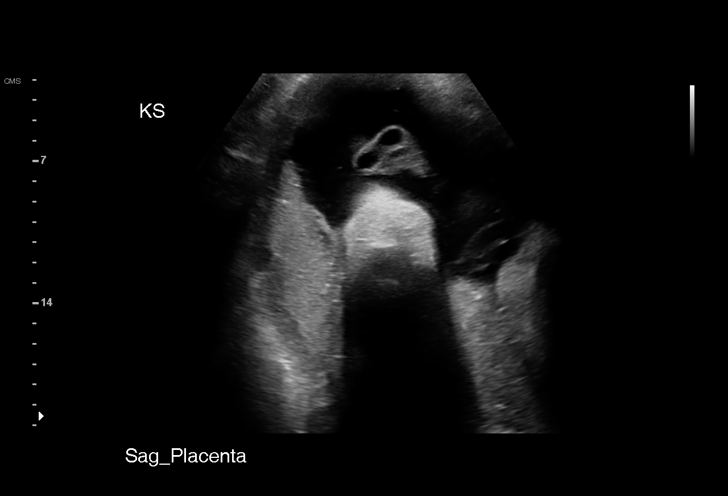
[im 12/108]
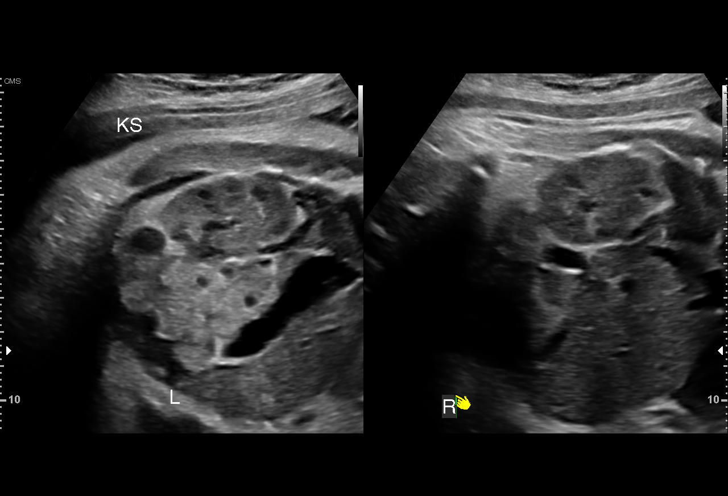
[im 20/108]
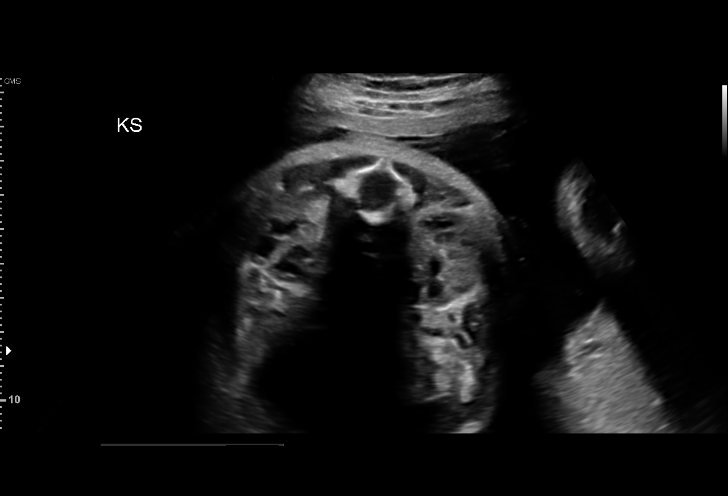
[im 28/108]
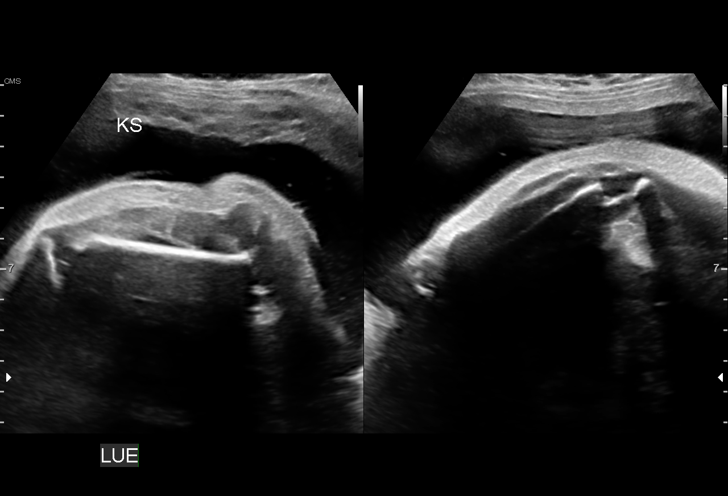
[im 36/108]
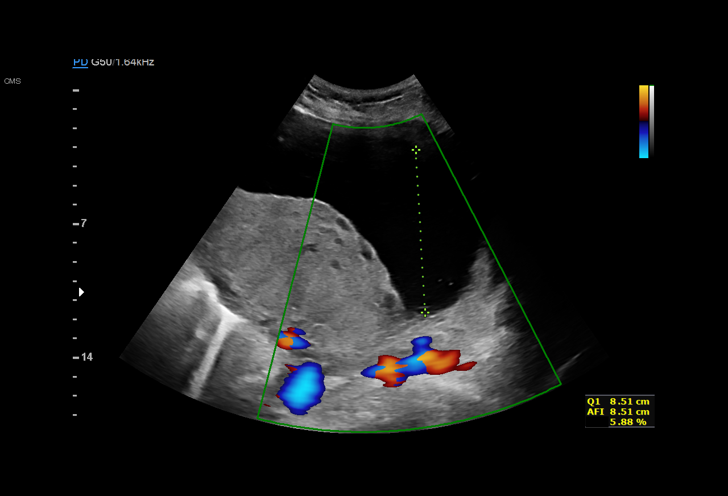
[im 44/108]
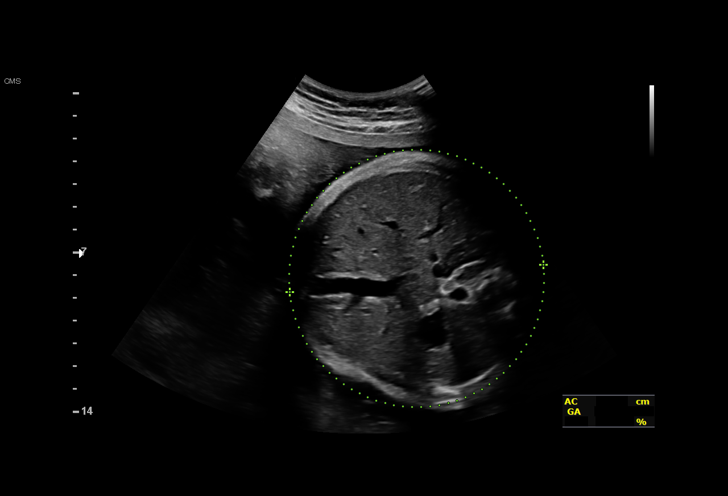
[im 52/108]
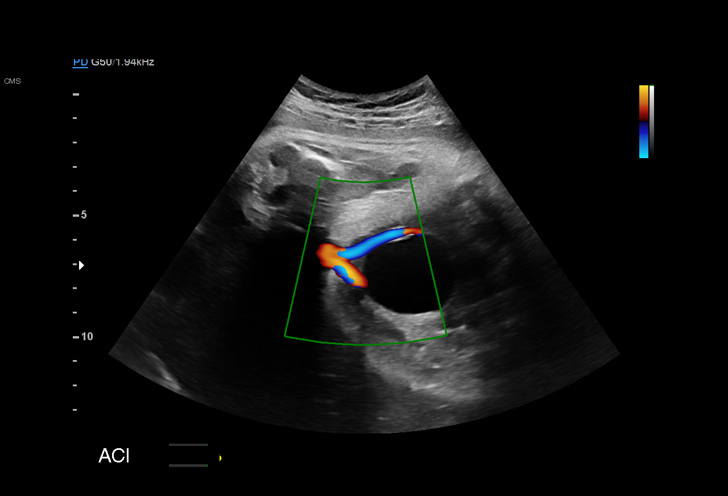
[im 60/108]
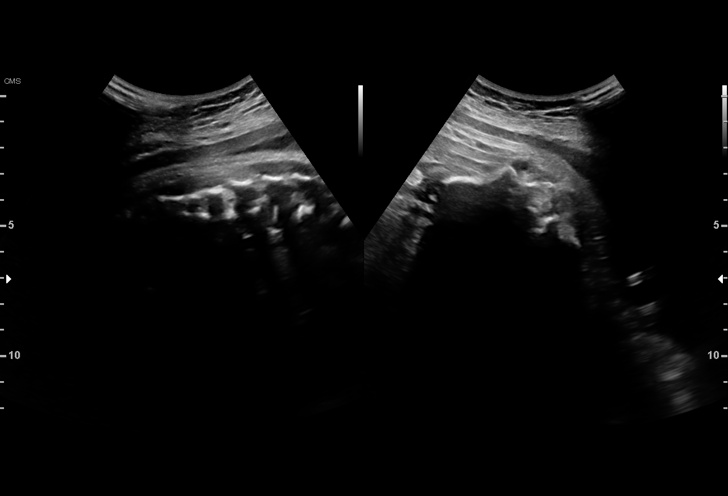
[im 68/108]
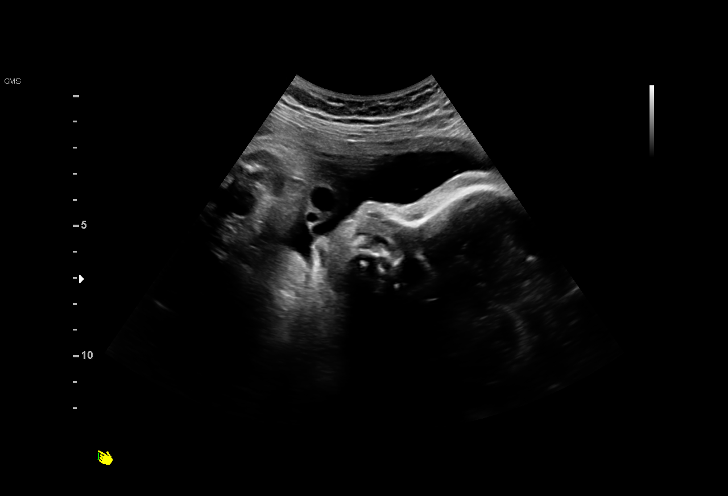
[im 76/108]
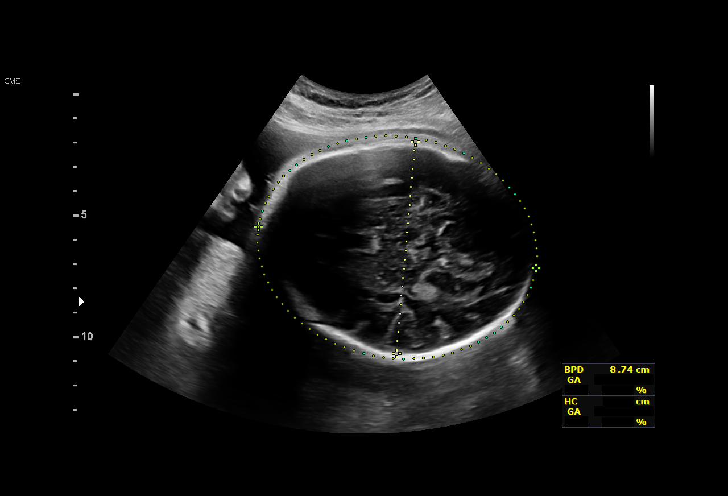
[im 84/108]
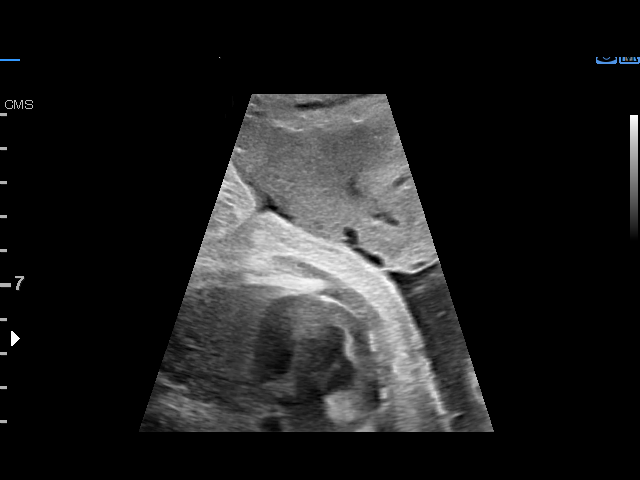
[im 92/108]
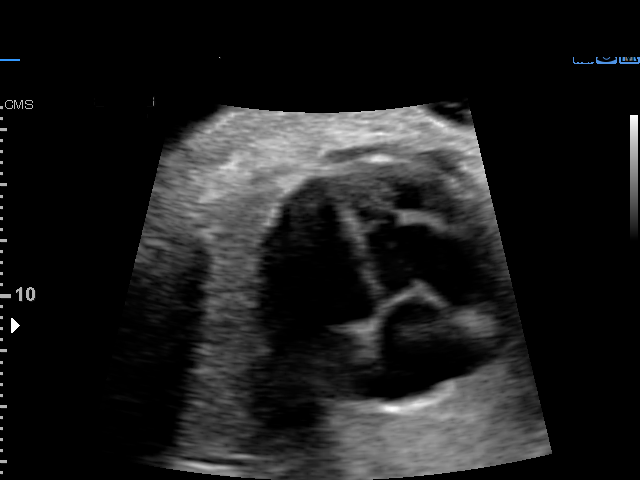
[im 100/108]
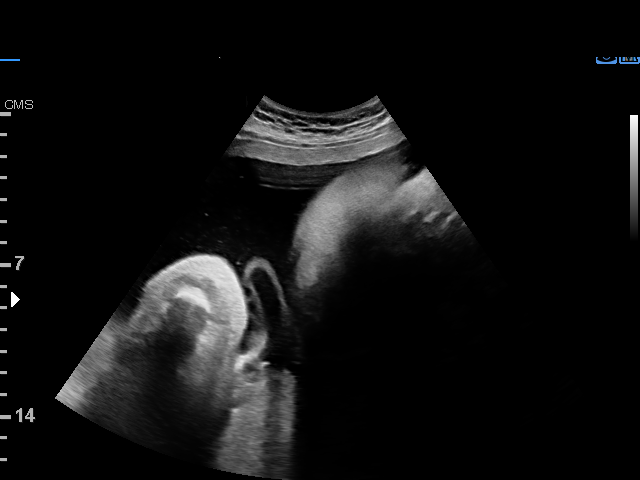
[im 108/108]
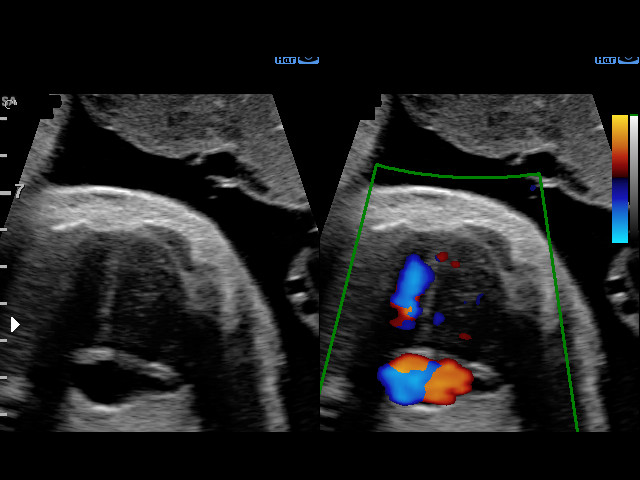

[14 of 28 positions shown; findings below may reference images not displayed]

----------------------------------------------------------------------

 ----------------------------------------------------------------------
Indications

  33 weeks gestation of pregnancy
  Encounter for antenatal screening for          [E7]
  malformations
  Fetal abnormality - other known or             [E7]
  suspected (office questions heart defect)
  Gestational diabetes in pregnancy, diet        [E7]
  controlled
  Previous cesarean delivery, antepartum x 2     [E7]
  Poor obstetric history: Prior fetal            [E7]
  macrosomia, antepartum (10+3, 11+3)
 ----------------------------------------------------------------------
Fetal Evaluation

 Num Of Fetuses:         1
 Fetal Heart Rate(bpm):  153
 Cardiac Activity:       Observed
 Presentation:           Cephalic
 Placenta:               Posterior
 P. Cord Insertion:      Visualized

 Amniotic Fluid
 AFI FV:      Subjectively increased

 AFI Sum(cm)     %Tile       Largest Pocket(cm)
 23.15           89

 RUQ(cm)       RLQ(cm)       LUQ(cm)        LLQ(cm)

Biometry

 BPD:      86.6  mm     G. Age:  35w 0d         85  %    CI:        72.34   %    70 - 86
                                                         FL/HC:      20.6   %    19.9 -
 HC:      323.9  mm     G. Age:  36w 5d         89  %    HC/AC:      0.93        0.96 -
 AC:      349.6  mm     G. Age:  38w 6d       > 97  %    FL/BPD:     77.1   %    71 - 87
 FL:       66.8  mm     G. Age:  34w 3d         64  %    FL/AC:      19.1   %    20 - 24
 HUM:      57.6  mm     G. Age:  33w 3d         58  %

 Est. FW:    [E7]  gm    6 lb 14 oz    > 90  %
OB History

 Gravidity:    3         Term:   2        Prem:   0        SAB:   0
 TOP:          0       Ectopic:  0        Living: 2
Gestational Age

 U/S Today:     36w 2d                                        EDD:   [DATE]
 Best:          33w 3d     Det. By:  Early Ultrasound         EDD:   [DATE]
                                     ([DATE])
Anatomy

 Cranium:               Appears normal         LVOT:                   Appears normal
 Cavum:                 Appears normal         Aortic Arch:            Appears normal
 Ventricles:            Appears normal         Ductal Arch:            Not well visualized
 Choroid Plexus:        Appears normal         Diaphragm:              Appears normal
 Cerebellum:            Appears normal         Stomach:                Appears normal, left
                                                                       sided
 Posterior Fossa:       Not well visualized    Abdomen:                Appears normal
 Nuchal Fold:           Not applicable (>20    Abdominal Wall:         Not well visualized
                        wks GA)
 Face:                  Orbits nl; profile not Cord Vessels:           Appears normal (3
                        well visualized                                vessel cord)
 Lips:                  Appears normal         Kidneys:                Appear normal
 Palate:                Not well visualized    Bladder:                Appears normal
 Thoracic:              Appears normal         Spine:                  Limited views
                                                                       appear normal
 Heart:                 Appears normal         Upper Extremities:      Appears normal
                        (4CH, axis, and
                        situs)
 RVOT:                  Appears normal         Lower Extremities:      Appears normal

 Other:  Nasal bone visualized.
Cervix Uterus Adnexa

 Cervix
 Not visualized (advanced GA >[E7])

 Uterus
 No abnormality visualized.

 Left Ovary
 Within normal limits.

 Right Ovary
 Within normal limits.

 Cul De Sac
 No free fluid seen.
 Adnexa
 No abnormality visualized.
Impression

 Live appropriately grown fetus with normal structural features
 within limitations of obstteric ultrasound. In particular,normal
 4-chamber view visualized.
Recommendations

 Follow up as clinically indicated and or scheduled.
                MAI

## 2018-09-02 ENCOUNTER — Other Ambulatory Visit: Payer: Self-pay | Admitting: Obstetrics and Gynecology

## 2018-09-29 ENCOUNTER — Telehealth (HOSPITAL_COMMUNITY): Payer: Self-pay | Admitting: *Deleted

## 2018-09-29 NOTE — Telephone Encounter (Signed)
Preadmission screen  

## 2018-09-30 ENCOUNTER — Telehealth (HOSPITAL_COMMUNITY): Payer: Self-pay | Admitting: *Deleted

## 2018-09-30 NOTE — Telephone Encounter (Signed)
Preadmission screen  

## 2018-10-01 ENCOUNTER — Telehealth (HOSPITAL_COMMUNITY): Payer: Self-pay | Admitting: *Deleted

## 2018-10-01 NOTE — Telephone Encounter (Signed)
Preadmission screen  

## 2018-10-02 ENCOUNTER — Telehealth (HOSPITAL_COMMUNITY): Payer: Self-pay | Admitting: *Deleted

## 2018-10-02 NOTE — Telephone Encounter (Signed)
Preadmission screen  

## 2018-10-03 ENCOUNTER — Telehealth (HOSPITAL_COMMUNITY): Payer: Self-pay | Admitting: *Deleted

## 2018-10-03 NOTE — Telephone Encounter (Signed)
Preadmission screen  

## 2018-10-06 ENCOUNTER — Telehealth (HOSPITAL_COMMUNITY): Payer: Self-pay | Admitting: *Deleted

## 2018-10-06 NOTE — Telephone Encounter (Signed)
Preadmission screen Unable to leave message at the new number the patient left on my voicemail.

## 2018-10-07 ENCOUNTER — Encounter (HOSPITAL_COMMUNITY): Payer: Self-pay

## 2018-10-09 ENCOUNTER — Encounter (HOSPITAL_COMMUNITY)
Admission: RE | Admit: 2018-10-09 | Discharge: 2018-10-09 | Disposition: A | Discharge status: Home / Self Care | Payer: Medicaid Other | Source: Ambulatory Visit | Attending: Obstetrics and Gynecology | Admitting: Obstetrics and Gynecology

## 2018-10-09 LAB — CBC
HCT: 28.9 % — ABNORMAL LOW (ref 36.0–46.0)
HEMOGLOBIN: 8.9 g/dL — AB (ref 12.0–15.0)
MCH: 23.9 pg — ABNORMAL LOW (ref 26.0–34.0)
MCHC: 30.8 g/dL (ref 30.0–36.0)
MCV: 77.7 fL — ABNORMAL LOW (ref 80.0–100.0)
NRBC: 0 % (ref 0.0–0.2)
Platelets: 249 10*3/uL (ref 150–400)
RBC: 3.72 MIL/uL — ABNORMAL LOW (ref 3.87–5.11)
RDW: 13.9 % (ref 11.5–15.5)
WBC: 7.4 10*3/uL (ref 4.0–10.5)

## 2018-10-09 LAB — TYPE AND SCREEN
ABO/RH(D): A POS
Antibody Screen: NEGATIVE

## 2018-10-09 NOTE — Patient Instructions (Addendum)
Lindsey Diaz  10/09/2018   Your procedure is scheduled on:  10/10/2018  Enter through the Main Entrance of [facility] at 1030 AM.  Pick up the phone at the desk and dial [phone]  Call this number if you have problems the morning of surgery:[phone]  Remember:   Do not eat food:(After Midnight) Desps de medianoche.  Do not drink clear liquids: (After Midnight) Desps de medianoche.  Take these medicines the morning of surgery with A SIP OF WATER: take 8units of insulin at bedtime tonight.  Do not take any insulin the day of surgery   Do not wear jewelry, make-up or nail polish.  Do not wear lotions, powders, or perfumes. Do not wear deodorant.  Do not shave 48 hours prior to surgery.  Do not bring valuables to the hospital.  [facility] is not   responsible for any belongings or valuables brought to the hospital.  Contacts, dentures or bridgework may not be worn into surgery.  Leave suitcase in the car. After surgery it may be brought to your room.  For patients admitted to the hospital, checkout time is 11:00 AM the day of              discharge.    N/A   Please read over the following fact sheets that you were given:   Surgical Site Infection Prevention

## 2018-10-10 ENCOUNTER — Inpatient Hospital Stay (HOSPITAL_COMMUNITY): Payer: Medicaid Other

## 2018-10-10 ENCOUNTER — Encounter (HOSPITAL_COMMUNITY): Payer: Self-pay

## 2018-10-10 ENCOUNTER — Other Ambulatory Visit: Payer: Self-pay

## 2018-10-10 ENCOUNTER — Encounter (HOSPITAL_COMMUNITY)
Admission: AD | Disposition: A | Discharge status: Home / Self Care | Payer: Self-pay | Source: Home / Self Care | Attending: Obstetrics and Gynecology

## 2018-10-10 ENCOUNTER — Inpatient Hospital Stay (HOSPITAL_COMMUNITY)
Admission: AD | Admit: 2018-10-10 | Discharge: 2018-10-13 | DRG: 784 | Disposition: A | Discharge status: Home / Self Care | Payer: Medicaid Other | Attending: Obstetrics and Gynecology | Admitting: Obstetrics and Gynecology

## 2018-10-10 DIAGNOSIS — Z3A39 39 weeks gestation of pregnancy: Secondary | ICD-10-CM | POA: Diagnosis not present

## 2018-10-10 DIAGNOSIS — Z302 Encounter for sterilization: Secondary | ICD-10-CM

## 2018-10-10 DIAGNOSIS — O99824 Streptococcus B carrier state complicating childbirth: Secondary | ICD-10-CM | POA: Diagnosis present

## 2018-10-10 DIAGNOSIS — D62 Acute posthemorrhagic anemia: Secondary | ICD-10-CM | POA: Diagnosis not present

## 2018-10-10 DIAGNOSIS — O34211 Maternal care for low transverse scar from previous cesarean delivery: Principal | ICD-10-CM | POA: Diagnosis present

## 2018-10-10 DIAGNOSIS — O9081 Anemia of the puerperium: Secondary | ICD-10-CM | POA: Diagnosis not present

## 2018-10-10 LAB — GLUCOSE, CAPILLARY
Glucose-Capillary: 72 mg/dL (ref 70–99)
Glucose-Capillary: 87 mg/dL (ref 70–99)

## 2018-10-10 LAB — RPR: RPR Ser Ql: NONREACTIVE

## 2018-10-10 SURGERY — Surgical Case
Anesthesia: Spinal | Site: Abdomen | Wound class: Clean Contaminated

## 2018-10-10 MED ORDER — COCONUT OIL OIL: 1.0000 "application " | TOPICAL_OIL | Status: DC | PRN

## 2018-10-10 MED ORDER — CEFAZOLIN SODIUM-DEXTROSE 2-4 GM/100ML-% IV SOLN
2.0000 g | INTRAVENOUS | Status: AC
  Administered 2018-10-10: 2 g via INTRAVENOUS
  Filled 2018-10-10: qty 100

## 2018-10-10 MED ORDER — BUPIVACAINE IN DEXTROSE 0.75-8.25 % IT SOLN
INTRATHECAL | Status: DC | PRN
  Administered 2018-10-10: 1.6 mL via INTRATHECAL

## 2018-10-10 MED ORDER — DIBUCAINE 1 % RE OINT: 1.0000 "application " | TOPICAL_OINTMENT | RECTAL | Status: DC | PRN

## 2018-10-10 MED ORDER — MENTHOL 3 MG MT LOZG: 1.0000 | LOZENGE | OROMUCOSAL | Status: DC | PRN

## 2018-10-10 MED ORDER — PHENYLEPHRINE 40 MCG/ML (10ML) SYRINGE FOR IV PUSH (FOR BLOOD PRESSURE SUPPORT)
PREFILLED_SYRINGE | INTRAVENOUS | Status: AC
  Filled 2018-10-10: qty 10

## 2018-10-10 MED ORDER — TRANEXAMIC ACID-NACL 1000-0.7 MG/100ML-% IV SOLN
INTRAVENOUS | Status: AC
  Filled 2018-10-10: qty 100

## 2018-10-10 MED ORDER — ZOLPIDEM TARTRATE 5 MG PO TABS: 5.0000 mg | ORAL_TABLET | Freq: Every evening | ORAL | Status: DC | PRN

## 2018-10-10 MED ORDER — FENTANYL CITRATE (PF) 100 MCG/2ML IJ SOLN
INTRAMUSCULAR | Status: AC
  Filled 2018-10-10: qty 2

## 2018-10-10 MED ORDER — LACTATED RINGERS IV SOLN
INTRAVENOUS | Status: DC
  Administered 2018-10-10 – 2018-10-11 (×3): via INTRAVENOUS

## 2018-10-10 MED ORDER — SIMETHICONE 80 MG PO CHEW
80.0000 mg | CHEWABLE_TABLET | Freq: Three times a day (TID) | ORAL | Status: DC
  Administered 2018-10-10 – 2018-10-13 (×8): 80 mg via ORAL
  Filled 2018-10-10 (×8): qty 1

## 2018-10-10 MED ORDER — OXYTOCIN 40 UNITS IN NORMAL SALINE INFUSION - SIMPLE MED: 2.5000 [IU]/h | INTRAVENOUS | Status: AC

## 2018-10-10 MED ORDER — OXYCODONE-ACETAMINOPHEN 5-325 MG PO TABS
1.0000 | ORAL_TABLET | ORAL | Status: DC | PRN
  Administered 2018-10-11 – 2018-10-13 (×2): 1 via ORAL
  Filled 2018-10-10 (×2): qty 1

## 2018-10-10 MED ORDER — PHENYLEPHRINE 8 MG IN D5W 100 ML (0.08MG/ML) PREMIX OPTIME
INJECTION | INTRAVENOUS | Status: AC
  Filled 2018-10-10: qty 100

## 2018-10-10 MED ORDER — KETOROLAC TROMETHAMINE 30 MG/ML IJ SOLN: 30.0000 mg | Freq: Four times a day (QID) | INTRAMUSCULAR | Status: AC | PRN

## 2018-10-10 MED ORDER — MEPERIDINE HCL 25 MG/ML IJ SOLN: 6.2500 mg | INTRAMUSCULAR | Status: DC | PRN

## 2018-10-10 MED ORDER — FENTANYL CITRATE (PF) 100 MCG/2ML IJ SOLN
INTRAMUSCULAR | Status: DC | PRN
  Administered 2018-10-10: 15 ug via INTRATHECAL

## 2018-10-10 MED ORDER — ONDANSETRON HCL 4 MG/2ML IJ SOLN
INTRAMUSCULAR | Status: AC
  Filled 2018-10-10: qty 2

## 2018-10-10 MED ORDER — PHENYLEPHRINE 8 MG IN D5W 100 ML (0.08MG/ML) PREMIX OPTIME
INJECTION | INTRAVENOUS | Status: DC | PRN
  Administered 2018-10-10: 40 ug/min via INTRAVENOUS

## 2018-10-10 MED ORDER — ONDANSETRON HCL 4 MG/2ML IJ SOLN
INTRAMUSCULAR | Status: DC | PRN
  Administered 2018-10-10: 4 mg via INTRAVENOUS

## 2018-10-10 MED ORDER — SCOPOLAMINE 1 MG/3DAYS TD PT72
MEDICATED_PATCH | TRANSDERMAL | Status: AC
  Filled 2018-10-10: qty 1

## 2018-10-10 MED ORDER — SIMETHICONE 80 MG PO CHEW: 80.0000 mg | CHEWABLE_TABLET | ORAL | Status: DC | PRN

## 2018-10-10 MED ORDER — DIPHENHYDRAMINE HCL 25 MG PO CAPS: 25.0000 mg | ORAL_CAPSULE | Freq: Four times a day (QID) | ORAL | Status: DC | PRN

## 2018-10-10 MED ORDER — PRENATAL MULTIVITAMIN CH
1.0000 | ORAL_TABLET | Freq: Every day | ORAL | Status: DC
  Administered 2018-10-11 – 2018-10-12 (×2): 1 via ORAL
  Filled 2018-10-10 (×2): qty 1

## 2018-10-10 MED ORDER — SCOPOLAMINE 1 MG/3DAYS TD PT72
MEDICATED_PATCH | TRANSDERMAL | Status: DC | PRN
  Administered 2018-10-10: 1 via TRANSDERMAL

## 2018-10-10 MED ORDER — OXYTOCIN 10 UNIT/ML IJ SOLN
INTRAVENOUS | Status: DC | PRN
  Administered 2018-10-10: 40 [IU] via INTRAVENOUS

## 2018-10-10 MED ORDER — TRANEXAMIC ACID-NACL 1000-0.7 MG/100ML-% IV SOLN
1000.0000 mg | INTRAVENOUS | Status: AC
  Administered 2018-10-10: 1000 mg via INTRAVENOUS

## 2018-10-10 MED ORDER — DEXAMETHASONE SODIUM PHOSPHATE 10 MG/ML IJ SOLN
INTRAMUSCULAR | Status: AC
  Filled 2018-10-10: qty 1

## 2018-10-10 MED ORDER — FENTANYL CITRATE (PF) 100 MCG/2ML IJ SOLN: 25.0000 ug | INTRAMUSCULAR | Status: DC | PRN

## 2018-10-10 MED ORDER — EPHEDRINE 5 MG/ML INJ
INTRAVENOUS | Status: AC
  Filled 2018-10-10: qty 10

## 2018-10-10 MED ORDER — SIMETHICONE 80 MG PO CHEW
80.0000 mg | CHEWABLE_TABLET | ORAL | Status: DC
  Administered 2018-10-10 – 2018-10-12 (×3): 80 mg via ORAL
  Filled 2018-10-10 (×3): qty 1

## 2018-10-10 MED ORDER — LACTATED RINGERS IV SOLN
INTRAVENOUS | Status: DC
  Administered 2018-10-10: 12:00:00 via INTRAVENOUS

## 2018-10-10 MED ORDER — KETOROLAC TROMETHAMINE 30 MG/ML IJ SOLN
INTRAMUSCULAR | Status: AC
  Filled 2018-10-10: qty 1

## 2018-10-10 MED ORDER — ONDANSETRON HCL 4 MG/2ML IJ SOLN: 4.0000 mg | Freq: Three times a day (TID) | INTRAMUSCULAR | Status: DC | PRN

## 2018-10-10 MED ORDER — LACTATED RINGERS IV SOLN
INTRAVENOUS | Status: DC | PRN
  Administered 2018-10-10: 13:00:00 via INTRAVENOUS

## 2018-10-10 MED ORDER — GLYCOPYRROLATE 0.2 MG/ML IJ SOLN
INTRAMUSCULAR | Status: AC
  Filled 2018-10-10: qty 1

## 2018-10-10 MED ORDER — MORPHINE SULFATE (PF) 0.5 MG/ML IJ SOLN
INTRAMUSCULAR | Status: AC
  Filled 2018-10-10: qty 10

## 2018-10-10 MED ORDER — SODIUM CHLORIDE 0.9 % IR SOLN
Status: DC | PRN
  Administered 2018-10-10: 1

## 2018-10-10 MED ORDER — SODIUM CHLORIDE 0.9% FLUSH: 3.0000 mL | INTRAVENOUS | Status: DC | PRN

## 2018-10-10 MED ORDER — TETANUS-DIPHTH-ACELL PERTUSSIS 5-2.5-18.5 LF-MCG/0.5 IM SUSP: 0.5000 mL | Freq: Once | INTRAMUSCULAR | Status: DC

## 2018-10-10 MED ORDER — WITCH HAZEL-GLYCERIN EX PADS: 1.0000 "application " | MEDICATED_PAD | CUTANEOUS | Status: DC | PRN

## 2018-10-10 MED ORDER — SENNOSIDES-DOCUSATE SODIUM 8.6-50 MG PO TABS
2.0000 | ORAL_TABLET | ORAL | Status: DC
  Administered 2018-10-10 – 2018-10-12 (×3): 2 via ORAL
  Filled 2018-10-10 (×3): qty 2

## 2018-10-10 MED ORDER — EPHEDRINE SULFATE 50 MG/ML IJ SOLN
INTRAMUSCULAR | Status: DC | PRN
  Administered 2018-10-10 (×2): 5 mg via INTRAVENOUS
  Administered 2018-10-10: 10 mg via INTRAVENOUS

## 2018-10-10 MED ORDER — IBUPROFEN 800 MG PO TABS
800.0000 mg | ORAL_TABLET | Freq: Three times a day (TID) | ORAL | Status: DC
  Administered 2018-10-10 – 2018-10-13 (×8): 800 mg via ORAL
  Filled 2018-10-10 (×8): qty 1

## 2018-10-10 MED ORDER — KETOROLAC TROMETHAMINE 30 MG/ML IJ SOLN
30.0000 mg | Freq: Four times a day (QID) | INTRAMUSCULAR | Status: AC | PRN
  Administered 2018-10-10: 30 mg via INTRAMUSCULAR

## 2018-10-10 MED ORDER — OXYTOCIN 10 UNIT/ML IJ SOLN
INTRAMUSCULAR | Status: AC
  Filled 2018-10-10: qty 4

## 2018-10-10 MED ORDER — GLYCOPYRROLATE 0.2 MG/ML IJ SOLN
INTRAMUSCULAR | Status: DC | PRN
  Administered 2018-10-10: 0.1 mg via INTRAVENOUS

## 2018-10-10 MED ORDER — NALOXONE HCL 0.4 MG/ML IJ SOLN: 0.4000 mg | INTRAMUSCULAR | Status: DC | PRN

## 2018-10-10 MED ORDER — ENOXAPARIN SODIUM 60 MG/0.6ML ~~LOC~~ SOLN
50.0000 mg | SUBCUTANEOUS | Status: DC
  Administered 2018-10-11 – 2018-10-13 (×3): 50 mg via SUBCUTANEOUS
  Filled 2018-10-10 (×3): qty 0.6

## 2018-10-10 MED ORDER — MORPHINE SULFATE (PF) 0.5 MG/ML IJ SOLN
INTRAMUSCULAR | Status: DC | PRN
  Administered 2018-10-10: .15 mg via INTRATHECAL

## 2018-10-10 MED ORDER — PHENYLEPHRINE HCL 10 MG/ML IJ SOLN
INTRAMUSCULAR | Status: DC | PRN
  Administered 2018-10-10: 40 ug via INTRAVENOUS

## 2018-10-10 MED ORDER — ACETAMINOPHEN 500 MG PO TABS
1000.0000 mg | ORAL_TABLET | Freq: Four times a day (QID) | ORAL | Status: DC
  Administered 2018-10-10 – 2018-10-12 (×8): 1000 mg via ORAL
  Filled 2018-10-10 (×10): qty 2

## 2018-10-10 SURGICAL SUPPLY — 36 items
BENZOIN TINCTURE PRP APPL 2/3 (GAUZE/BANDAGES/DRESSINGS) ×1 IMPLANT
BLADE SURG 10 STRL SS (BLADE) ×1 IMPLANT
CLAMP CORD UMBIL (MISCELLANEOUS) IMPLANT
CLIP FILSHIE TUBAL LIGA STRL (Clip) ×1 IMPLANT
CLOSURE STERI STRIP 1/2 X4 (GAUZE/BANDAGES/DRESSINGS) ×1 IMPLANT
CLOTH BEACON ORANGE TIMEOUT ST (SAFETY) ×2 IMPLANT
DRSG OPSITE POSTOP 4X10 (GAUZE/BANDAGES/DRESSINGS) ×2 IMPLANT
ELECT REM PT RETURN 9FT ADLT (ELECTROSURGICAL) ×2
ELECTRODE REM PT RTRN 9FT ADLT (ELECTROSURGICAL) ×1 IMPLANT
EXTRACTOR VACUUM M CUP 4 TUBE (SUCTIONS) IMPLANT
GAUZE SPONGE 4X4 12PLY STRL LF (GAUZE/BANDAGES/DRESSINGS) ×2 IMPLANT
GLOVE BIOGEL PI IND STRL 6.5 (GLOVE) ×1 IMPLANT
GLOVE BIOGEL PI IND STRL 7.0 (GLOVE) ×1 IMPLANT
GLOVE BIOGEL PI INDICATOR 6.5 (GLOVE) ×1
GLOVE BIOGEL PI INDICATOR 7.0 (GLOVE) ×1
GLOVE ECLIPSE 6.5 STRL STRAW (GLOVE) ×2 IMPLANT
GOWN STRL REUS W/TWL LRG LVL3 (GOWN DISPOSABLE) ×4 IMPLANT
KIT ABG SYR 3ML LUER SLIP (SYRINGE) IMPLANT
NDL HYPO 25X5/8 SAFETYGLIDE (NEEDLE) IMPLANT
NEEDLE HYPO 25X5/8 SAFETYGLIDE (NEEDLE) IMPLANT
NS IRRIG 1000ML POUR BTL (IV SOLUTION) ×2 IMPLANT
PACK C SECTION WH (CUSTOM PROCEDURE TRAY) ×2 IMPLANT
PAD ABD 7.5X8 STRL (GAUZE/BANDAGES/DRESSINGS) ×2 IMPLANT
PAD OB MATERNITY 4.3X12.25 (PERSONAL CARE ITEMS) ×2 IMPLANT
PENCIL SMOKE EVAC W/HOLSTER (ELECTROSURGICAL) ×2 IMPLANT
RTRCTR C-SECT PINK 25CM LRG (MISCELLANEOUS) ×1 IMPLANT
SPONGE LAP 18X18 RF (DISPOSABLE) ×6 IMPLANT
SUT MON AB 2-0 CT1 27 (SUTURE) ×2 IMPLANT
SUT MON AB 4-0 PS1 27 (SUTURE) IMPLANT
SUT PDS AB 0 CTX 60 (SUTURE) IMPLANT
SUT PLAIN 2 0 XLH (SUTURE) ×1 IMPLANT
SUT VIC AB 0 CTX 36 (SUTURE) ×4
SUT VIC AB 0 CTX36XBRD ANBCTRL (SUTURE) ×4 IMPLANT
SUT VIC AB 4-0 KS 27 (SUTURE) ×1 IMPLANT
TOWEL OR 17X24 6PK STRL BLUE (TOWEL DISPOSABLE) ×2 IMPLANT
TRAY FOLEY W/BAG SLVR 14FR LF (SET/KITS/TRAYS/PACK) ×2 IMPLANT

## 2018-10-10 NOTE — Op Note (Signed)
Lindsey Diaz, Lindsey Diaz MEDICAL RECORD [id number] ACCOUNT [id number] DATE OF BIRTH:Dec 07, 1985 FACILITY: [facility] LOCATION: [id number] PHYSICIAN:Lakely Elmendorf M. Claiborne Billings, DO  OPERATIVE REPORT  DATE OF PROCEDURE:  10/10/2018  PREOPERATIVE DIAGNOSIS:  Desired repeat cesarean section and desired permanent sterilization.  POSTOPERATIVE DIAGNOSIS:  Desired repeat cesarean section and desired permanent sterilization.  PROCEDURE:  Low transverse cesarean section with bilateral tubal ligation with Filshie clips.  SURGEON:  Philip Aspen, DO  ASSISTANT:  Malva Limes, MD  ANESTHESIA:  Spinal.  ESTIMATED BLOOD LOSS:  410 mL.  Abundant amniotic fluid present at time of delivery.  FINDINGS:  Female infant, cephalic presentation, Apgars 8 and 9, weight pending.  Normal tubes and ovaries bilaterally.  Anterior abdominal wall scar tissue, but minimal intraperitoneal scar tissue.  SPECIMENS:  Cord blood.  COMPLICATIONS:  None.  CONDITION:  Stable to PACU.  DESCRIPTION OF PROCEDURE:  The patient was taken to the operating room where spinal anesthesia was administered and found to be adequate.  She was prepped and draped in the normal sterile fashion in dorsal supine position with a leftward tilt.  A scalpel  was used to make a Pfannenstiel skin incision which was carried down to underlying layer of fascia with Bovie cautery.  The tissue planes were not distinct due to above-mentioned scar tissue.  Careful dissection down to the fascia was performed.  Kocher  clamps were placed at the superior aspect of the fascial incision and rectus muscles were dissected off with Bovie scalpel and Mayo scissors with care taken to ensure no underlying layers or underlying structures were compromised prior to taking down  this kind of scar tissue.  A window was created through the peritoneum on the patient's left.  The peritoneum was entered bluntly and fingers were used to sweep underneath additional area that was  going to be incised.  Once this incision was extended,  manual lateral traction was performed to enlarge the incision.  Manual survey of the abdomen and pelvis was performed and findings noted above.  The lower uterine segment was very thin with almost no myometrium present covering the amniotic sac.  A  bladder flap was developed by tenting the vesicouterine peritoneum and entering sharply with Metzenbaum scissors and extended laterally and developing the bladder flap digitally.  A scalpel was used to make a low transverse cesarean incision and the  amniotic sac was entered sharply.  The incision was extended by cephalic and caudal traction and the infant's head was elevated and delivered without difficulty.  Due to the size of the infant and the large diameter of the shoulders, there was a slight  delay in delivery due to needing to rotate and ease the infant from the incision.  Once the infant was freed, she appeared vigorous after initial appearance of lethargy and shock.  The cord was clamped and cut and infant was handed off to awaiting  neonatology without completing the 30-second delayed cord clamping partially because of concern for bleeding for the mother given that her initial hemoglobin was quite low and IV Lysteda was administered to help with blood loss.  Because of the large  baby and chance for postpartum hemorrhage given macrosomia, the infant was handed off without the delayed cord clamping.  Cord blood was collected and external massage of the uterus was performed to maximize tone with gentle traction on the umbilical  cord.  The placenta was removed without difficulty and the uterus was cleared of all clot and debris.  The uterine incision was reapproximated and  closed with Vicryl in a running locked fashion followed by one figure-of-eight at the midline to facilitate  excellent hemostasis.  The right tube was identified, followed to its fimbriated end, elevated with a Babcock clamp and  the Filshie clip was placed with good visualization that had entirely incorporated the tube.  The exact same thing was performed on  the left side.  Both ovaries were normal and the incision was reexamined and continued to be hemostatic.  The [employer] was removed that had been placed at the start of the case and again the incision was examined and found to be hemostatic.   The peritoneum was reapproximated and closed with Monocryl in a running fashion.  The fascia was then reapproximated and closed with looped PDS in a running fashion.  There was a moderate amount of oozing from multiple small bleeders that was controlled  with Bovie.  The prior keloid scar at the level of the skin was removed by grasping the keloided portion serially with Allis clamps, incising with the scalpel and separating with Bovie cautery plate.  Some undermining of the upper aspect of the incision  was performed so the skin closure would have ease and no tension.  Chromic on a big needle was used to reapproximate subcutaneous tissue and 5 interrupted sutures.  Skin was then reapproximated and closed in a subcuticular fashion with a Mellody Dance needle on  Vicryl.  The patient tolerated the procedure well.  Sponge, lap and needle counts were correct x2.  The patient was taken to recovery in stable condition.  TN/NUANCE  D:10/10/2018 T:10/10/2018 JOB:[id number]

## 2018-10-10 NOTE — Anesthesia Preprocedure Evaluation (Addendum)
Anesthesia Evaluation  Patient identified by MRN, date of birth, ID band Patient awake    Reviewed: Allergy & Precautions, NPO status , Patient's Chart, lab work & pertinent test results  Airway Mallampati: II  TM Distance: >3 FB Neck ROM: Full    Dental  (+) Teeth Intact   Pulmonary neg pulmonary ROS,    Pulmonary exam normal breath sounds clear to auscultation       Cardiovascular negative cardio ROS Normal cardiovascular exam Rhythm:Regular Rate:Normal     Neuro/Psych negative neurological ROS  negative psych ROS   GI/Hepatic negative GI ROS, Neg liver ROS,   Endo/Other  diabetes, Well Controlled, Gestational  Renal/GU negative Renal ROS  negative genitourinary   Musculoskeletal negative musculoskeletal ROS (+)   Abdominal (+) + obese,   Peds  Hematology  (+) anemia ,   Anesthesia Other Findings   Reproductive/Obstetrics (+) Pregnancy Previous C/Section x 2 Suspected fetal macrosomia                             Anesthesia Physical Anesthesia Plan  ASA: II  Anesthesia Plan: Spinal   Post-op Pain Management:    Induction:   PONV Risk Score and Plan: 4 or greater and Scopolamine patch - Pre-op, Ondansetron and Treatment may vary due to age or medical condition  Airway Management Planned: Natural Airway  Additional Equipment:   Intra-op Plan:   Post-operative Plan:   Informed Consent: I have reviewed the patients History and Physical, chart, labs and discussed the procedure including the risks, benefits and alternatives for the proposed anesthesia with the patient or authorized representative who has indicated his/her understanding and acceptance.       Plan Discussed with: CRNA and Surgeon  Anesthesia Plan Comments:        Anesthesia Quick Evaluation

## 2018-10-10 NOTE — Transfer of Care (Signed)
Immediate Anesthesia Transfer of Care Note  Patient: Lindsey Diaz  Procedure(s) Performed: CESAREAN SECTION WITH BILATERAL TUBAL LIGATION (N/A Abdomen)  Patient Location: PACU  Anesthesia Type:Spinal  Level of Consciousness: awake, alert  and oriented  Airway & Oxygen Therapy: Patient Spontanous Breathing  Post-op Assessment: Report given to RN and Post -op Vital signs reviewed and stable  Post vital signs: Reviewed  Last Vitals:  Vitals Value Taken Time  BP    Temp    Pulse 69 10/10/2018  1:40 PM  Resp 16 10/10/2018  1:40 PM  SpO2 99 % 10/10/2018  1:40 PM  Vitals shown include unvalidated device data.  Last Pain:  Vitals:   10/10/18 1049  TempSrc: Oral  PainSc: 0-No pain         Complications: No apparent anesthesia complications

## 2018-10-10 NOTE — Anesthesia Postprocedure Evaluation (Signed)
Anesthesia Post Note  Patient: Lindsey Diaz  Procedure(s) Performed: CESAREAN SECTION WITH BILATERAL TUBAL LIGATION (N/A Abdomen)     Patient location during evaluation: Mother Baby Anesthesia Type: Spinal Level of consciousness: awake and alert and oriented Pain management: satisfactory to patient Vital Signs Assessment: post-procedure vital signs reviewed and stable Respiratory status: respiratory function stable and spontaneous breathing Cardiovascular status: blood pressure returned to baseline Postop Assessment: no headache, no backache, spinal receding, patient able to bend at knees and adequate PO intake Anesthetic complications: no    Last Vitals:  Vitals:   10/10/18 1600 10/10/18 1715  BP: (!) 124/51 (!) 103/57  Pulse: (!) 53 (!) 54  Resp: 20 18  Temp: 36.6 C 36.6 C  SpO2: 96% 98%    Last Pain:  Vitals:   10/10/18 1752  TempSrc:   PainSc: 2    Pain Goal:                   Delvon Chipps

## 2018-10-10 NOTE — Addendum Note (Signed)
Addendum  created 10/10/18 1819 by Graciela Husbands, CRNA   Clinical Note Signed

## 2018-10-10 NOTE — Lactation Note (Signed)
This note was copied from a baby's chart. Lactation Consultation Note  Patient Name: Lindsey Diaz Today's Date: 10/10/2018   P3, Baby 5 hours old.  Mother stopped breastfeeding her 31 yr old yesterday and bf her first child for 2 years. Mother was doubting her milk supply so reviewed hand expression w/ good flow.  Baby has bf x 2 and formula x 2 since birth. Encouraged mother to breastfeed. Feed on demand approximately 8-12 times per day.   Mother denies questions or problems. Mom made aware of O/P services, breastfeeding support groups, community resources, and our phone # for post-discharge questions.       Maternal Data    Feeding Feeding Type: Bottle Fed - Formula Nipple Type: Slow - flow  LATCH Score Latch: Grasps breast easily, tongue down, lips flanged, rhythmical sucking.  Audible Swallowing: A few with stimulation  Type of Nipple: Everted at rest and after stimulation  Comfort (Breast/Nipple): Soft / non-tender  Hold (Positioning): Assistance needed to correctly position infant at breast and maintain latch.  LATCH Score: 8  Interventions    Lactation Tools Discussed/Used     Consult Status      Lindsey Diaz, Lindsey Diaz [facility] 10/10/2018, 6:03 PM

## 2018-10-10 NOTE — Brief Op Note (Signed)
10/10/2018  1:32 PM  PATIENT:  Lindsey Diaz  33 y.o. female  PRE-OPERATIVE DIAGNOSIS:  REPEAT and undesired fertility EDD: 10/17/18 NKDA  POST-OPERATIVE DIAGNOSIS:  REPEAT and undesired fertility  PROCEDURE:  Procedure(s): CESAREAN SECTION WITH BILATERAL TUBAL LIGATION (N/A)  SURGEON:  Surgeon(s) and Role:    Claiborne Billings, Luther Parody, DO - Primary    * Levi Aland, MD - Assisting  ANESTHESIA:   spinal  EBL:  410 mL   FINDINGS: female infant, cephalic presentation, APGARS 8/9, weight pending, normal tubes and ovaries bilaterally, anterior abd wall scar tissue, but minimal intraperitoneal scar tissue  SPECIMEN:  Source of Specimen:  cord blood  DISPOSITION OF SPECIMEN:  N/A  COUNTS:  YES  PLAN OF CARE: Admit to inpatient   PATIENT DISPOSITION:  PACU - hemodynamically stable.   Delay start of Pharmacological VTE agent (>24hrs) due to surgical blood loss or risk of bleeding: not applicable

## 2018-10-10 NOTE — Transfer of Care (Deleted)
Immediate Anesthesia Transfer of Care Note  Patient: Lindsey Diaz  Procedure(s) Performed: CESAREAN SECTION WITH BILATERAL TUBAL LIGATION (N/A Abdomen)  Patient Location: PACU  Anesthesia Type:Spinal  Level of Consciousness: awake, alert , oriented and sedated  Airway & Oxygen Therapy: Patient Spontanous Breathing  Post-op Assessment: Report given to RN  Post vital signs: Reviewed and stable  Last Vitals:  Vitals Value Taken Time  BP    Temp    Pulse 25 10/10/2018  1:40 PM  Resp    SpO2 82 % 10/10/2018  1:40 PM  Vitals shown include unvalidated device data.  Last Pain:  Vitals:   10/10/18 1049  TempSrc: Oral  PainSc: 0-No pain         Complications: No apparent anesthesia complications and cardiovascular complications

## 2018-10-10 NOTE — H&P (Signed)
33 y.o. [redacted]w[redacted]d  G3P2002 comes in for scheduled repeat cesarean section.  Otherwise has good fetal movement and no bleeding.  Past Medical History:  Diagnosis Date  . Gestational diabetes   . Medical history non-contributory     Past Surgical History:  Procedure Laterality Date  . CESAREAN SECTION    . CESAREAN SECTION N/A 10/22/2016   Procedure: CESAREAN SECTION;  Surgeon: Philip Aspen, DO;  Location: [facility]IRTHING SU[facility];  Service: Obstetrics;  Laterality: N/A;    OB History  Gravida Para Term Preterm AB Living  3 2 2  0 0 2  SAB TAB Ectopic Multiple Live Births  0 0 0 0 2    # Outcome Date GA Lbr Len/2nd Weight Sex Delivery Anes PTL Lv  3 Current           2 Term 10/22/16 [redacted]w[redacted]d  5100 g F CS-LTranv Spinal  LIV  1 Term 2016   4621 g M CS-LTranv   LIV    Social History   Socioeconomic History  . Marital status: Legally Separated    Spouse name: Not on file  . Number of children: Not on file  . Years of education: Not on file  . Highest education level: Not on file  Occupational History  . Not on file  Social Needs  . Financial resource strain: Not hard at all  . Food insecurity:    Worry: Never true    Inability: Never true  . Transportation needs:    Medical: No    Non-medical: Not on file  Tobacco Use  . Smoking status: Never Smoker  . Smokeless tobacco: Never Used  Substance and Sexual Activity  . Alcohol use: No  . Drug use: No  . Sexual activity: Not Currently  Lifestyle  . Physical activity:    Days per week: Not on file    Minutes per session: Not on file  . Stress: Not on file  Relationships  . Social connections:    Talks on phone: Not on file    Gets together: Not on file    Attends religious service: Not on file    Active member of club or organization: Not on file    Attends meetings of clubs or organizations: Not on file    Relationship status: Not on file  . Intimate partner violence:    Fear of current or ex partner: No    Emotionally  abused: No    Physically abused: No    Forced sexual activity: No  Other Topics Concern  . Not on file  Social History Narrative  . Not on file   Patient has no known allergies.    Prenatal Transfer Tool  Maternal Diabetes: Yes:  Diabetes Type:  Insulin/Medication controlled Genetic Screening: Normal Maternal Ultrasounds/Referrals: Normal Fetal Ultrasounds or other Referrals:  Referred to Materal Fetal Medicine  Maternal Substance Abuse:  No Significant Maternal Medications:  Meds include: Other:  Significant Maternal Lab Results: GBS+  Other PNC: US showing possible hypoplastic heart, referred to MFM, f/u scan heart looked normal    Vitals:   10/10/18 1024  Weight: 95.2 kg  Height: 5\' 4"  (1.626 m)   Per office exam: Lungs/Cor:  NAD Abdomen:  soft, gravid Ex:  no cords, erythema   A/P   Admit for scheduled repeat c/s with BTL.  GBS Pos  2g Anceg and other rouine pre-op care Philip Aspen

## 2018-10-10 NOTE — Anesthesia Procedure Notes (Signed)
Spinal  Patient location during procedure: OR Start time: 10/10/2018 12:17 PM End time: 10/10/2018 12:22 PM Staffing Anesthesiologist: Mal Amabile, MD Performed: anesthesiologist  Preanesthetic Checklist Completed: patient identified, site marked, surgical consent, pre-op evaluation, timeout performed, IV checked, risks and benefits discussed and monitors and equipment checked Spinal Block Patient position: sitting Prep: site prepped and draped and DuraPrep Patient monitoring: heart rate, cardiac monitor, continuous pulse ox and blood pressure Approach: midline Location: L3-4 Injection technique: single-shot Needle Needle type: Pencan  Needle gauge: 24 G Needle length: 9 cm Needle insertion depth: 6 cm Assessment Sensory level: T4 Additional Notes Patient tolerated procedure well. Adequate sensory level.

## 2018-10-10 NOTE — Progress Notes (Signed)
1355 CBG in PACU-87

## 2018-10-10 NOTE — Anesthesia Postprocedure Evaluation (Signed)
Anesthesia Post Note  Patient: Lindsey Diaz  Procedure(s) Performed: CESAREAN SECTION WITH BILATERAL TUBAL LIGATION (N/A Abdomen)     Patient location during evaluation: PACU Anesthesia Type: Spinal Level of consciousness: oriented and awake and alert Pain management: pain level controlled Vital Signs Assessment: post-procedure vital signs reviewed and stable Respiratory status: spontaneous breathing, respiratory function stable and nonlabored ventilation Cardiovascular status: blood pressure returned to baseline and stable Postop Assessment: no headache, no backache, no apparent nausea or vomiting and spinal receding Anesthetic complications: no    Last Vitals:  Vitals:   10/10/18 1345 10/10/18 1400  BP: (!) 109/55   Pulse: 71 (!) 59  Resp: 20 14  Temp: 36.4 C   SpO2: 100% 99%    Last Pain:  Vitals:   10/10/18 1345  TempSrc: Oral  PainSc: 0-No pain   Pain Goal:    LLE Motor Response: No movement due to regional block (10/10/18 1345) LLE Sensation: No sensation (absent) (10/10/18 1345) RLE Motor Response: No movement due to regional block (10/10/18 1345) RLE Sensation: No sensation (absent) (10/10/18 1345)     Epidural/Spinal Function Cutaneous sensation: No Sensation (10/10/18 1345), Patient able to flex knees: No (10/10/18 1345), Patient able to lift hips off bed: No (10/10/18 1345), Back pain beyond tenderness at insertion site: No (10/10/18 1345), Progressively worsening motor and/or sensory loss: No (10/10/18 1345), Bowel and/or bladder incontinence post epidural: No (10/10/18 1345)  Domonique Cothran A.

## 2018-10-11 LAB — CBC
HCT: 25 % — ABNORMAL LOW (ref 36.0–46.0)
Hemoglobin: 7.9 g/dL — ABNORMAL LOW (ref 12.0–15.0)
MCH: 24.6 pg — ABNORMAL LOW (ref 26.0–34.0)
MCHC: 31.6 g/dL (ref 30.0–36.0)
MCV: 77.9 fL — ABNORMAL LOW (ref 80.0–100.0)
Platelets: 210 10*3/uL (ref 150–400)
RBC: 3.21 MIL/uL — AB (ref 3.87–5.11)
RDW: 14.1 % (ref 11.5–15.5)
WBC: 9.5 10*3/uL (ref 4.0–10.5)
nRBC: 0 % (ref 0.0–0.2)

## 2018-10-11 LAB — BIRTH TISSUE RECOVERY COLLECTION (PLACENTA DONATION)

## 2018-10-11 MED ORDER — FERROUS SULFATE 325 (65 FE) MG PO TABS
325.0000 mg | ORAL_TABLET | Freq: Two times a day (BID) | ORAL | Status: DC
  Administered 2018-10-11 – 2018-10-13 (×5): 325 mg via ORAL
  Filled 2018-10-11 (×5): qty 1

## 2018-10-11 NOTE — Lactation Note (Signed)
This note was copied from a baby's chart. Lactation Consultation Note  Patient Name: Lindsey Diaz Today's Date: 10/11/2018 Reason for consult: Follow-up assessment;Term P1, 15 hour female infant. LC entered the room infant cuing to breastfeed. LC reviewed hand expression and infant was given 5 ml of EBM  by spoon.  Mom latched infant to left breast using cross cradle hold infant latched well, wide mouth with  swallows observed and heard. Mom know to breastfeed infant by hunger cues and not exceed 3 hours without breastfeeding infant. Per mom, she is no longer feeling pain with latch and she feeling more confident with breastfeeding. Mom will call Nurse or LC if she has any further questions, concerns or need assistance with latching infant to breast.    Maternal Data    Feeding Feeding Type: Breast Fed Nipple Type: Slow - flow  LATCH Score Latch: Grasps breast easily, tongue down, lips flanged, rhythmical sucking.  Audible Swallowing: Spontaneous and intermittent  Type of Nipple: Everted at rest and after stimulation  Comfort (Breast/Nipple): Soft / non-tender  Hold (Positioning): Assistance needed to correctly position infant at breast and maintain latch.  LATCH Score: 9  Interventions Interventions: Assisted with latch;Breast compression;Adjust position;Hand express;Support pillows;Position options  Lactation Tools Discussed/Used     Consult Status Consult Status: Follow-up Date: 10/12/18 Follow-up type: In-patient    [name] 10/11/2018, 4:44 AM

## 2018-10-11 NOTE — Progress Notes (Signed)
POD#1 Pt without complaints. Voiding well.Lochia mild VSSAF Hgb-7.9 preop- 8.9 IMP ./ stable Plan/ D/C IVFs.

## 2018-10-12 ENCOUNTER — Encounter (HOSPITAL_COMMUNITY): Payer: Self-pay | Admitting: *Deleted

## 2018-10-12 NOTE — Lactation Note (Signed)
This note was copied from a baby's chart. Lactation Consultation Note  Patient Name: Girl Grainne Pasquarelli Today's Date: 10/12/2018 Reason for consult: Follow-up assessment;Term;Infant weight loss;Other (Comment)(5% weight loss )  Baby is 49 hours ,  LC reviewed doc flow sheets and updated the doc flow sheets per mom .  Mom recently breast fed for 3 mins and fed her baby 60 ml or formula due to  Feeling she has no milk .  LC reviewed supply and demand/ and that this is moms 3 rd baby and she is experienced breast feeder.  Milk should come sooner than later.  LC recommended feeding the baby on both breast prior to offering a supplement.  LC offered to set mom up with the DEBP for post pumping and mom declined for now/ will continue to breast feed.     Maternal Data Has patient been taught Hand Expression?: Yes  Feeding Feeding Type: (pe rmom last fed at 1:37 pm ) Nipple Type: Slow - flow  LATCH Score                   Interventions Interventions: Breast feeding basics reviewed  Lactation Tools Discussed/Used     Consult Status Consult Status: Follow-up Date: 10/13/18 Follow-up type: In-patient    Matilde Sprang Tarek Cravens 10/12/2018, 2:10 PM

## 2018-10-12 NOTE — Progress Notes (Signed)
POD#2 Pt states that she is doing well. She has mild lochia, On lovenox VSSAF IMP/ Stable Plan/ routine care

## 2018-10-13 MED ORDER — OXYCODONE HCL 5 MG PO TABS: 5.0000 mg | ORAL_TABLET | Freq: Four times a day (QID) | ORAL | 0 refills | Status: AC | PRN

## 2018-10-13 MED ORDER — IBUPROFEN 600 MG PO TABS: 600.0000 mg | ORAL_TABLET | Freq: Four times a day (QID) | ORAL | 0 refills | Status: AC | PRN

## 2018-10-13 NOTE — Discharge Summary (Signed)
Obstetric Discharge Summary Reason for Admission: cesarean section Prenatal Procedures: none Intrapartum Procedures: tubal ligation Postpartum Procedures: none Complications-Operative and Postpartum: none Hemoglobin  Date Value Ref Range Status  10/11/2018 7.9 (L) 12.0 - 15.0 g/dL Final   HCT  Date Value Ref Range Status  10/11/2018 25.0 (L) 36.0 - 46.0 % Final    Physical Exam:  General: alert, cooperative and appears stated age 33: appropriate Uterine Fundus: firm Incision: healing well, no significant drainage, no dehiscence DVT Evaluation: No evidence of DVT seen on physical exam.  Discharge Diagnoses: Term Pregnancy-delivered  Discharge Information: Date: 10/13/2018 Activity: pelvic rest Diet: routine Medications: PNV, Ibuprofen and tylenol and oxycodone Condition: stable Instructions: refer to practice specific booklet Discharge to: home Follow-up Information    Philip Aspen, DO Follow up in 4 week(s).   Specialty:  Obstetrics and Gynecology Contact information: [address] [place] [address] [phone]           Newborn Data: Live born female  Birth Weight: 11 lb 14.5 oz (5400 g) APGAR: 8, 9  Newborn Delivery   Birth date/time:  10/10/2018 12:50:00 Delivery type:  C-Section, Low Vertical Trial of labor:  No C-section categorization:  Repeat     Home with mother.  Lindsey Diaz GEFFEL Lindsey Diaz 10/13/2018, 12:11 PM

## 2018-10-13 NOTE — Plan of Care (Signed)
Patient appropriate for discharge.

## 2018-10-13 NOTE — Progress Notes (Signed)
Patient is doing well.  She is tolerating PO, ambulating, voiding.  Pain is controlled.  Lochia is appropriate  Vitals:   10/12/18 0534 10/12/18 1434 10/12/18 2215 10/13/18 0508  BP: 115/66 105/62 135/79 120/65  Pulse: 63 (!) 51 (!) 53 71  Resp: 16 12 18 16   Temp: (!) 97.5 F (36.4 C) 98 F (36.7 C) (!) 97.5 F (36.4 C) 98.2 F (36.8 C)  TempSrc: Oral Oral Oral Oral  SpO2: 100% 99% 99% 98%  Weight:      Height:        NAD Abdomen:  soft, appropriate tenderness, incisions intact and without erythema ext:    Symmetric, trace edema bilaterally  Lab Results  Component Value Date   WBC 9.5 10/11/2018   HGB 7.9 (L) 10/11/2018   HCT 25.0 (L) 10/11/2018   MCV 77.9 (L) 10/11/2018   PLT 210 10/11/2018    --/--/A POS (01/16 1111)/RImmune  A/P    32 y.o. G3P2002 POD 3 s/p rcs/btl Routine post op and postpartum care.   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  Discharge to home today.

## 2018-10-13 NOTE — Lactation Note (Signed)
This note was copied from a baby's chart. Lactation Consultation Note  Patient Name: Lindsey Diaz Today's Date: 10/13/2018 Reason for consult: Follow-up assessment;Infant weight loss;Term  Baby is 8068 hours old  Per mom last fed at 0700 and breast  Fed both breast and supplemented 60 ml.  LC reviewed supply and demand / sore nipple and engorgement prevention and tx  Reviewed. Per mom has a pump at home.  Plans to keep I/O 's .  Mother informed of post-discharge support and given phone number to the lactation department, including services for phone call assistance; out-patient appointments; and breastfeeding support group. List of other breastfeeding resources in the community given in the handout. Encouraged mother to call for problems or concerns related to breastfeeding.    Maternal Data    Feeding Feeding Type: (per mom recently fed at 0700 ) Nipple Type: Slow - flow  LATCH Score ( [facility] )  Latch: Grasps breast easily, tongue down, lips flanged, rhythmical sucking.  Audible Swallowing: A few with stimulation  Type of Nipple: Everted at rest and after stimulation  Comfort (Breast/Nipple): Soft / non-tender  Hold (Positioning): No assistance needed to correctly position infant at breast.  LATCH Score: 9  Interventions Interventions: Breast feeding basics reviewed  Lactation Tools Discussed/Used Pump Review: Milk Storage   Consult Status Consult Status: Complete Date: 10/13/18    Lindsey Diaz 10/13/2018, 9:11 AM

## 2019-01-09 ENCOUNTER — Encounter (HOSPITAL_COMMUNITY): Payer: Self-pay

## 2023-05-26 ENCOUNTER — Encounter (HOSPITAL_COMMUNITY): Payer: Self-pay

## 2023-05-26 ENCOUNTER — Emergency Department (HOSPITAL_COMMUNITY): Payer: Medicaid Other

## 2023-05-26 ENCOUNTER — Emergency Department (HOSPITAL_COMMUNITY)
Admission: EM | Admit: 2023-05-26 | Discharge: 2023-05-27 | Disposition: A | Discharge status: Home / Self Care | Payer: Medicaid Other | Attending: Emergency Medicine | Admitting: Emergency Medicine

## 2023-05-26 ENCOUNTER — Other Ambulatory Visit: Payer: Self-pay

## 2023-05-26 DIAGNOSIS — H9202 Otalgia, left ear: Secondary | ICD-10-CM | POA: Diagnosis not present

## 2023-05-26 DIAGNOSIS — M542 Cervicalgia: Secondary | ICD-10-CM | POA: Insufficient documentation

## 2023-05-26 LAB — CBC WITH DIFFERENTIAL/PLATELET
Abs Immature Granulocytes: 0.02 10*3/uL (ref 0.00–0.07)
Basophils Absolute: 0.1 10*3/uL (ref 0.0–0.1)
Basophils Relative: 1 %
Eosinophils Absolute: 0.2 10*3/uL (ref 0.0–0.5)
Eosinophils Relative: 3 %
HCT: 29.4 % — ABNORMAL LOW (ref 36.0–46.0)
Hemoglobin: 8.4 g/dL — ABNORMAL LOW (ref 12.0–15.0)
Immature Granulocytes: 0 %
Lymphocytes Relative: 46 %
Lymphs Abs: 3.8 10*3/uL (ref 0.7–4.0)
MCH: 19.9 pg — ABNORMAL LOW (ref 26.0–34.0)
MCHC: 28.6 g/dL — ABNORMAL LOW (ref 30.0–36.0)
MCV: 69.7 fL — ABNORMAL LOW (ref 80.0–100.0)
Monocytes Absolute: 0.5 10*3/uL (ref 0.1–1.0)
Monocytes Relative: 6 %
Neutro Abs: 3.6 10*3/uL (ref 1.7–7.7)
Neutrophils Relative %: 44 %
Platelets: 372 10*3/uL (ref 150–400)
RBC: 4.22 MIL/uL (ref 3.87–5.11)
RDW: 16.7 % — ABNORMAL HIGH (ref 11.5–15.5)
WBC: 8.2 10*3/uL (ref 4.0–10.5)
nRBC: 0 % (ref 0.0–0.2)

## 2023-05-26 LAB — I-STAT CHEM 8, ED
BUN: 9 mg/dL (ref 6–20)
Calcium, Ion: 1.16 mmol/L (ref 1.15–1.40)
Chloride: 105 mmol/L (ref 98–111)
Creatinine, Ser: 0.6 mg/dL (ref 0.44–1.00)
Glucose, Bld: 96 mg/dL (ref 70–99)
HCT: 28 % — ABNORMAL LOW (ref 36.0–46.0)
Hemoglobin: 9.5 g/dL — ABNORMAL LOW (ref 12.0–15.0)
Potassium: 3.7 mmol/L (ref 3.5–5.1)
Sodium: 139 mmol/L (ref 135–145)
TCO2: 22 mmol/L (ref 22–32)

## 2023-05-26 LAB — PREGNANCY, URINE: Preg Test, Ur: NEGATIVE

## 2023-05-26 NOTE — ED Triage Notes (Addendum)
PT arrived POV from home c/o otalgia in the left ear that started last week but last night was the worst she has had. Currently it does not hurt.

## 2023-05-26 NOTE — ED Provider Notes (Incomplete)
  [place] EMERGENCY DEPARTMENT AT [facility] Provider Note   CSN: [id number] Arrival date & time: 05/26/23  1833     History {Add pertinent medical, surgical, social history, OB history to HPI:1} Chief Complaint  Patient presents with   Otalgia    Lindsey Diaz is a 37 y.o. female.   Otalgia      Home Medications Prior to Admission medications   Medication Sig Start Date End Date Taking? Authorizing Provider  ibuprofen (ADVIL,MOTRIN) 600 MG tablet Take 1 tablet (600 mg total) by mouth every 6 (six) hours as needed. 10/13/18   Marlow Baars, MD  oxyCODONE (OXY IR/ROXICODONE) 5 MG immediate release tablet Take 1 tablet (5 mg total) by mouth every 6 (six) hours as needed for severe pain. 10/13/18   Marlow Baars, MD      Allergies    Patient has no known allergies.    Review of Systems   Review of Systems  HENT:  Positive for ear pain.     Physical Exam Updated Vital Signs BP 124/77 (BP Location: Right Arm)   Pulse 81   Temp 99 F (37.2 C) (Oral)   Resp 14   Ht 5\' 4"  (1.626 m)   Wt 78.9 kg   SpO2 98%   BMI 29.87 kg/m  Physical Exam  ED Results / Procedures / Treatments   Labs (all labs ordered are listed, but only abnormal results are displayed) Labs Reviewed - No data to display  EKG None  Radiology No results found.  Procedures Procedures  {Document cardiac monitor, telemetry assessment procedure when appropriate:1}  Medications Ordered in ED Medications - No data to display  ED Course/ Medical Decision Making/ A&P   {   Click here for ABCD2, HEART and other calculatorsREFRESH Note before signing :1}                              Medical Decision Making  ***  {Document critical care time when appropriate:1} {Document review of labs and clinical decision tools ie heart score, Chads2Vasc2 etc:1}  {Document your independent review of radiology images, and any outside records:1} {Document your discussion with family members,  caretakers, and with consultants:1} {Document social determinants of health affecting pt's care:1} {Document your decision making why or why not admission, treatments were needed:1} Final Clinical Impression(s) / ED Diagnoses Final diagnoses:  None    Rx / DC Orders ED Discharge Orders     None

## 2023-05-27 MED ORDER — IOHEXOL 350 MG/ML SOLN
75.0000 mL | Freq: Once | INTRAVENOUS | Status: AC | PRN
  Administered 2023-05-27: 75 mL via INTRAVENOUS

## 2023-05-27 NOTE — Discharge Instructions (Addendum)
Your CT scan was consistent with post-operative changes.  It would be beneficial to compare today's CT with an prior imaging done after your surgery.  For this purpose, it is recommended that you follow-up with your ENT for comparison.   You were seen here today for evaluation of your neck and ear pain.  You will need to follow-up with your ENT provider that performed your surgery years prior.  I have included information for him into the discharge report.  Please make sure you call to schedule an appointment.  If you have any concerns, new or worsening symptoms, please return to your nearest Emergency Department for reevaluation.  Contact a health care provider if: Your pain does not improve within 2 days. Your earache gets worse. You have new symptoms. You have a fever. Get help right away if: You have a severe headache. You have a stiff neck. You have trouble swallowing. You have redness or swelling behind your ear. You have fluid or blood coming from your ear. You have hearing loss. You feel dizzy.

## 2023-05-27 NOTE — ED Provider Notes (Signed)
CT consistent with postoperative changes.  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  Would be beneficial to compare against prior CTs.  Feel that patient is stable for discharge and outpatient follow-up.  I have advised her to follow-up with her ENT so that the CTs can be compared.  She is agreeable with the plan.   Roxy Horseman, PA-C 05/27/23 0224    Gilda Crease, MD 05/29/23 [phone]
